# Patient Record
Sex: Male | Born: 1970 | Race: White | Hispanic: No | Marital: Married | Smoking: Never smoker
Health system: Southern US, Community
[De-identification: ages and names within clinical notes are randomized; demographics above are authoritative.]

## PROBLEM LIST (undated history)

## (undated) DIAGNOSIS — I10 Essential (primary) hypertension: Secondary | ICD-10-CM

## (undated) DIAGNOSIS — Z8619 Personal history of other infectious and parasitic diseases: Secondary | ICD-10-CM

## (undated) DIAGNOSIS — Z1283 Encounter for screening for malignant neoplasm of skin: Secondary | ICD-10-CM

## (undated) DIAGNOSIS — E785 Hyperlipidemia, unspecified: Secondary | ICD-10-CM

## (undated) DIAGNOSIS — Z87442 Personal history of urinary calculi: Secondary | ICD-10-CM

## (undated) DIAGNOSIS — J45909 Unspecified asthma, uncomplicated: Secondary | ICD-10-CM

## (undated) DIAGNOSIS — C801 Malignant (primary) neoplasm, unspecified: Secondary | ICD-10-CM

## (undated) DIAGNOSIS — R51 Headache: Secondary | ICD-10-CM

## (undated) DIAGNOSIS — T7840XA Allergy, unspecified, initial encounter: Secondary | ICD-10-CM

## (undated) DIAGNOSIS — K219 Gastro-esophageal reflux disease without esophagitis: Secondary | ICD-10-CM

## (undated) HISTORY — DX: Hyperlipidemia, unspecified: E78.5

## (undated) HISTORY — DX: Personal history of other infectious and parasitic diseases: Z86.19

## (undated) HISTORY — DX: Personal history of urinary calculi: Z87.442

## (undated) HISTORY — DX: Essential (primary) hypertension: I10

## (undated) HISTORY — DX: Allergy, unspecified, initial encounter: T78.40XA

## (undated) HISTORY — DX: Malignant (primary) neoplasm, unspecified: C80.1

## (undated) HISTORY — PX: LASIK: SHX215

## (undated) HISTORY — DX: Gastro-esophageal reflux disease without esophagitis: K21.9

## (undated) HISTORY — DX: Encounter for screening for malignant neoplasm of skin: Z12.83

## (undated) HISTORY — DX: Unspecified asthma, uncomplicated: J45.909

## (undated) HISTORY — PX: SHOULDER SURGERY: SHX246

## (undated) HISTORY — DX: Headache: R51

---

## 2000-09-03 HISTORY — PX: OTHER SURGICAL HISTORY: SHX169

## 2006-12-04 ENCOUNTER — Emergency Department (HOSPITAL_COMMUNITY): Admission: EM | Admit: 2006-12-04 | Discharge: 2006-12-04 | Payer: Self-pay | Admitting: Family Medicine

## 2007-05-24 ENCOUNTER — Emergency Department (HOSPITAL_COMMUNITY): Admission: EM | Admit: 2007-05-24 | Discharge: 2007-05-24 | Payer: Self-pay | Admitting: Family Medicine

## 2008-07-28 ENCOUNTER — Ambulatory Visit: Payer: Self-pay | Admitting: Family Medicine

## 2008-07-28 DIAGNOSIS — R51 Headache: Secondary | ICD-10-CM

## 2008-07-28 DIAGNOSIS — Z87442 Personal history of urinary calculi: Secondary | ICD-10-CM | POA: Insufficient documentation

## 2008-07-28 DIAGNOSIS — K219 Gastro-esophageal reflux disease without esophagitis: Secondary | ICD-10-CM

## 2008-07-28 DIAGNOSIS — J309 Allergic rhinitis, unspecified: Secondary | ICD-10-CM | POA: Insufficient documentation

## 2008-07-28 DIAGNOSIS — R519 Headache, unspecified: Secondary | ICD-10-CM | POA: Insufficient documentation

## 2008-08-01 ENCOUNTER — Ambulatory Visit: Payer: Self-pay | Admitting: Cardiology

## 2008-09-06 ENCOUNTER — Ambulatory Visit: Payer: Self-pay | Admitting: Family Medicine

## 2008-09-06 LAB — CONVERTED CEMR LAB
Blood in Urine, dipstick: NEGATIVE
Glucose, Urine, Semiquant: NEGATIVE
Nitrite: NEGATIVE
Protein, U semiquant: NEGATIVE
Specific Gravity, Urine: 1.01
Urobilinogen, UA: 0.2

## 2008-09-11 LAB — CONVERTED CEMR LAB
AST: 19 units/L (ref 0–37)
Albumin: 4 g/dL (ref 3.5–5.2)
BUN: 13 mg/dL (ref 6–23)
Basophils Relative: 1.5 % (ref 0.0–3.0)
Bilirubin, Direct: 0.1 mg/dL (ref 0.0–0.3)
Cholesterol: 206 mg/dL (ref 0–200)
Direct LDL: 127.9 mg/dL
Eosinophils Absolute: 0.2 10*3/uL (ref 0.0–0.7)
Lymphocytes Relative: 25 % (ref 12.0–46.0)
MCHC: 34.4 g/dL (ref 30.0–36.0)
MCV: 94.6 fL (ref 78.0–100.0)
Monocytes Absolute: 0.4 10*3/uL (ref 0.1–1.0)
Neutro Abs: 3.8 10*3/uL (ref 1.4–7.7)
Platelets: 204 10*3/uL (ref 150–400)
Potassium: 4.3 meq/L (ref 3.5–5.1)
RDW: 12.4 % (ref 11.5–14.6)
Sodium: 142 meq/L (ref 135–145)
TSH: 0.79 microintl units/mL (ref 0.35–5.50)
Total Bilirubin: 0.8 mg/dL (ref 0.3–1.2)
Triglycerides: 68 mg/dL (ref 0–149)
VLDL: 14 mg/dL (ref 0–40)
WBC: 6 10*3/uL (ref 4.5–10.5)

## 2008-09-18 ENCOUNTER — Ambulatory Visit: Payer: Self-pay | Admitting: Family Medicine

## 2008-09-18 DIAGNOSIS — D179 Benign lipomatous neoplasm, unspecified: Secondary | ICD-10-CM | POA: Insufficient documentation

## 2008-10-04 ENCOUNTER — Encounter: Payer: Self-pay | Admitting: Family Medicine

## 2008-11-07 ENCOUNTER — Encounter: Payer: Self-pay | Admitting: Family Medicine

## 2008-11-17 ENCOUNTER — Ambulatory Visit (HOSPITAL_BASED_OUTPATIENT_CLINIC_OR_DEPARTMENT_OTHER): Admission: RE | Admit: 2008-11-17 | Discharge: 2008-11-17 | Payer: Self-pay | Admitting: Surgery

## 2008-11-17 ENCOUNTER — Encounter (INDEPENDENT_AMBULATORY_CARE_PROVIDER_SITE_OTHER): Payer: Self-pay | Admitting: Surgery

## 2008-11-24 ENCOUNTER — Ambulatory Visit: Admission: RE | Admit: 2008-11-24 | Discharge: 2008-12-28 | Payer: Self-pay | Admitting: Radiation Oncology

## 2008-11-24 ENCOUNTER — Encounter: Payer: Self-pay | Admitting: Family Medicine

## 2008-12-15 ENCOUNTER — Encounter: Payer: Self-pay | Admitting: Family Medicine

## 2008-12-28 ENCOUNTER — Encounter: Payer: Self-pay | Admitting: Family Medicine

## 2009-05-01 ENCOUNTER — Ambulatory Visit: Payer: Self-pay | Admitting: Family Medicine

## 2009-05-01 DIAGNOSIS — B356 Tinea cruris: Secondary | ICD-10-CM

## 2009-05-24 ENCOUNTER — Telehealth: Payer: Self-pay | Admitting: Family Medicine

## 2009-06-29 ENCOUNTER — Encounter: Payer: Self-pay | Admitting: Family Medicine

## 2010-01-21 ENCOUNTER — Emergency Department (HOSPITAL_COMMUNITY): Admission: EM | Admit: 2010-01-21 | Discharge: 2010-01-21 | Payer: Self-pay | Admitting: Emergency Medicine

## 2010-06-10 ENCOUNTER — Ambulatory Visit: Payer: Self-pay | Admitting: Family Medicine

## 2010-06-10 DIAGNOSIS — IMO0002 Reserved for concepts with insufficient information to code with codable children: Secondary | ICD-10-CM

## 2010-06-21 ENCOUNTER — Ambulatory Visit: Payer: Self-pay | Admitting: Family Medicine

## 2010-10-14 ENCOUNTER — Ambulatory Visit: Payer: Self-pay | Admitting: Family Medicine

## 2010-10-14 DIAGNOSIS — L600 Ingrowing nail: Secondary | ICD-10-CM

## 2010-12-03 NOTE — Assessment & Plan Note (Signed)
Summary: R THUMB INFECTED? // RS   Vital Signs:  Patient profile:   40 year old male Weight:      206 pounds BMI:     26.91 BP sitting:   146 / 96  (left arm) Cuff size:   regular  Vitals Entered By: Raechel Ache, RN (June 10, 2010 4:24 PM) CC: C/o ?infection R thumb- sore and draining.   History of Present Illness: Here for one month of swelling and tenderness around  the right thumbnail. Sometimes he can squeeze pus out of the area.   Allergies (verified): No Known Drug Allergies  Past History:  Past Medical History: Reviewed history from 07/28/2008 and no changes required. Chickenpox Allergic rhinitis GERD Headache Nephrolithiasis, hx of  Past Surgical History: Reviewed history from 05/01/2009 and no changes required. 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 Manus Rudd, then had                                                                                                MOHS on 12-15-08 per Dr. Park Liter  Review of Systems  The patient denies anorexia, fever, weight loss, weight gain, vision loss, decreased hearing, hoarseness, chest pain, syncope, dyspnea on exertion, peripheral edema, prolonged cough, headaches, hemoptysis, abdominal pain, melena, hematochezia, severe indigestion/heartburn, hematuria, incontinence, genital sores, muscle weakness, suspicious skin lesions, transient blindness, difficulty walking, depression, unusual weight change, abnormal bleeding, enlarged lymph nodes, angioedema, breast masses, and testicular masses.    Physical Exam  General:  Well-developed,well-nourished,in no acute distress; alert,appropriate and cooperative throughout examination Skin:  the right thumb is slightly puffy and red and tender around the base of the nail.  Axillary Nodes:  No palpable lymphadenopathy   Impression & Recommendations:  Problem # 1:  PARONYCHIA (ICD-681.9)  His  updated medication list for this problem includes:    Keflex 500 Mg Caps (Cephalexin) .Marland Kitchen... Three times a day  Complete Medication List: 1)  Imitrex 100 Mg Tabs (Sumatriptan succinate) .... As needed 2)  Vicodin 5-500 Mg Tabs (Hydrocodone-acetaminophen) .Marland Kitchen.. 1 every 4 hours as needed pain 3)  Midrin 325-65-100 Mg Caps (Apap-isometheptene-dichloral) .... 2 at onset of ha, then one per hour until relief 4)  Flonase 50 Mcg/act Susp (Fluticasone propionate) .... 2 sprays each nostril once daily 5)  Ketoconazole 2 % Crea (Ketoconazole) .... Apply three times a day as needed 6)  Keflex 500 Mg Caps (Cephalexin) .... Three times a day 7)  Claritin-d 24 Hour 10-240 Mg Xr24h-tab (Loratadine-pseudoephedrine) .... Once daily  Patient Instructions: 1)  Please schedule a follow-up appointment as needed .  Prescriptions: KEFLEX 500 MG CAPS (CEPHALEXIN) three times a day  #30 x 0   Entered and Authorized by:   Nelwyn Salisbury MD   Signed by:   Nelwyn Salisbury MD on 06/10/2010   Method used:   Electronically to        [facility] [phone]* (retail)       [address]       [place], [place]  [address]       Ph: [phone]       Fax: [phone]   RxID:   [id number]

## 2010-12-03 NOTE — Assessment & Plan Note (Signed)
Summary: finger is still no better/dm   Vital Signs:  Patient profile:   40 year old male Weight:      206 pounds BMI:     26.91 BP sitting:   146 / 90  (left arm) Cuff size:   regular  Vitals Entered By: Raechel Ache, RN (June 21, 2010 2:42 PM) CC: F/u R thumb; took abx and still not cleared up.   History of Present Illness: Here for a paronychia on the edge of the right thumbnail. He finshed a course of keflex with no improvement.   Allergies (verified): No Known Drug Allergies  Past History:  Past Medical History: Reviewed history from 07/28/2008 and no changes required. Chickenpox Allergic rhinitis GERD Headache Nephrolithiasis, hx of  Review of Systems  The patient denies anorexia, fever, weight loss, weight gain, vision loss, decreased hearing, hoarseness, chest pain, syncope, dyspnea on exertion, peripheral edema, prolonged cough, headaches, hemoptysis, abdominal pain, melena, hematochezia, severe indigestion/heartburn, hematuria, incontinence, genital sores, muscle weakness, suspicious skin lesions, transient blindness, difficulty walking, depression, unusual weight change, abnormal bleeding, enlarged lymph nodes, angioedema, breast masses, and testicular masses.    Physical Exam  General:  Well-developed,well-nourished,in no acute distress; alert,appropriate and cooperative throughout examination Skin:  the right thumb has erythema ans swelling along the nail. tender but not fluctuant   Impression & Recommendations:  Problem # 1:  PARONYCHIA (ICD-681.9)  His updated medication list for this problem includes:    Doxycycline Hyclate 100 Mg Caps (Doxycycline hyclate) .Marland Kitchen..Marland Kitchen Two times a day  Complete Medication List: 1)  Imitrex 100 Mg Tabs (Sumatriptan succinate) .... As needed 2)  Vicodin 5-500 Mg Tabs (Hydrocodone-acetaminophen) .Marland Kitchen.. 1 every 4 hours as needed pain 3)  Midrin 325-65-100 Mg Caps (Apap-isometheptene-dichloral) .... 2 at onset of ha, then one  per hour until relief 4)  Flonase 50 Mcg/act Susp (Fluticasone propionate) .... 2 sprays each nostril once daily 5)  Ketoconazole 2 % Crea (Ketoconazole) .... Apply three times a day as needed 6)  Claritin-d 24 Hour 10-240 Mg Xr24h-tab (Loratadine-pseudoephedrine) .... Once daily 7)  Doxycycline Hyclate 100 Mg Caps (Doxycycline hyclate) .... Two times a day  Patient Instructions: 1)  Please schedule a follow-up appointment as needed . Try hot soaks with Epsom salts Prescriptions: DOXYCYCLINE HYCLATE 100 MG CAPS (DOXYCYCLINE HYCLATE) two times a day  #28 x 0   Entered and Authorized by:   Nelwyn Salisbury MD   Signed by:   Nelwyn Salisbury MD on 06/21/2010   Method used:   Electronically to        [facility] [address]* (retail)       [address]       [place], [place]  [address]       Ph: [phone]       Fax: [phone]   RxID:   [id number]

## 2010-12-05 NOTE — Assessment & Plan Note (Signed)
Summary: INFECTION? // RS   Vital Signs:  Patient profile:   40 year old male Weight:      199 pounds Temp:     98.5 degrees F Pulse rhythm:   regular BP sitting:   132 / 82  Vitals Entered By: Lynann Beaver CMA AAMA (October 14, 2010 10:33 AM) CC: still complaining of painful right thumb Is Patient Diabetic? No Pain Assessment Patient in pain? no        History of Present Illness: Here to recheck his right thumb and nail. He has had irritation along the nail edge for at least 6 months now, and he has taken several courses of antibiotics. The infeciton seems to be gone, but he has a calluses area of skin that stays tender.   Allergies: No Known Drug Allergies  Past History:  Past Medical History: Reviewed history from 07/28/2008 and no changes required. Chickenpox Allergic rhinitis GERD Headache Nephrolithiasis, hx of  Past Surgical History: Reviewed history from 05/01/2009 and no changes required. 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 Manus Rudd, then had                                                                                                MOHS on 12-15-08 per Dr. Park Liter  Review of Systems  The patient denies anorexia, fever, weight loss, weight gain, vision loss, decreased hearing, hoarseness, chest pain, syncope, dyspnea on exertion, peripheral edema, prolonged cough, headaches, hemoptysis, abdominal pain, melena, hematochezia, severe indigestion/heartburn, hematuria, incontinence, genital sores, muscle weakness, suspicious skin lesions, transient blindness, difficulty walking, depression, unusual weight change, abnormal bleeding, enlarged lymph nodes, angioedema, breast masses, and testicular masses.    Physical Exam  General:  Well-developed,well-nourished,in no acute distress; alert,appropriate and cooperative throughout examination Skin:  the right thumb has a  tender callused area at the edge of the nail which causes the nail to be ingrown   Impression & Recommendations:  Problem # 1:  INGROWN NAIL (ICD-703.0)  Complete Medication List: 1)  Imitrex 100 Mg Tabs (Sumatriptan succinate) .... As needed 2)  Vicodin 5-500 Mg Tabs (Hydrocodone-acetaminophen) .Marland Kitchen.. 1 every 4 hours as needed pain 3)  Midrin 325-65-100 Mg Caps (Apap-isometheptene-dichloral) .... 2 at onset of ha, then one per hour until relief 4)  Flonase 50 Mcg/act Susp (Fluticasone propionate) .... 2 sprays each nostril once daily  Patient Instructions: 1)  This area needs to be resected, so I suggested he contact Dr. Terri Piedra his dermatologist for this    Orders Added: 1)  Est. Patient Level III [[address]]

## 2011-02-17 LAB — BASIC METABOLIC PANEL
BUN: 11 mg/dL (ref 6–23)
Chloride: 108 mEq/L (ref 96–112)
Creatinine, Ser: 1.01 mg/dL (ref 0.4–1.5)
GFR calc Af Amer: >60 mL/min (ref >60)
Glucose, Bld: 97 mg/dL (ref 70–99)
Potassium: 4.1 mEq/L (ref 3.5–5.1)

## 2011-02-17 LAB — DIFFERENTIAL
Basophils Relative: 0 % (ref 0–1)
Eosinophils Relative: 3 % (ref 0–5)
Lymphocytes Relative: 31 % (ref 12–46)
Monocytes Absolute: 0.5 10*3/uL (ref 0.1–1.0)
Monocytes Relative: 9 % (ref 3–12)

## 2011-02-17 LAB — POCT HEMOGLOBIN-HEMACUE: Hemoglobin: 14.8 g/dL (ref 13.0–17.0)

## 2011-02-17 LAB — CBC
RBC: 4.23 MIL/uL (ref 4.22–5.81)
RDW: 12.7 % (ref 11.5–15.5)

## 2011-03-18 NOTE — Op Note (Signed)
Casey Allen, SPRUNG               ACCOUNT NO.:  [id number]   MEDICAL RECORD NO.:  [id number]          PATIENT TYPE:  AMB   LOCATION:  [facility]                          FACILITY:  [facility]   PHYSICIAN:  Wilmon Arms. Corliss Skains, M.D. DATE OF BIRTH:  Oct 31, 1971   DATE OF PROCEDURE:  11/17/2008  DATE OF DISCHARGE:                               OPERATIVE REPORT   PREOPERATIVE DIAGNOSIS:  Dermatofibrosarcoma of the left shoulder.   POSTOPERATIVE DIAGNOSIS:  Dermatofibrosarcoma of the left shoulder.   PROCEDURE PERFORMED:  Wide excision of a dermatofibrosarcoma of the left  shoulder.   SURGEON:  Wilmon Arms. Corliss Skains, MD, FACS   ANESTHESIA:  General.   INDICATIONS:  The patient is a 40 year old male who presented with a  palpable mass in the subcutaneous tissues of the left shoulder.  This  has been present for 5 or 6 years.  We excised this in the office.  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  He presents now  for wide excision with 1 cm margins.   DESCRIPTION OF PROCEDURE:  The patient was brought to the operating  room, placed in supine position on the stretcher.  After an adequate  level of general anesthesia was obtained, he was put to prone position  with his left arm at his side.  His left posterior shoulder was prepped  with Betadine and draped in sterile fashion.  A time-out was taken to  assure the proper patient and proper procedure.  I outlined an  elliptical incision around the previous biopsy site maintaining 1 cm  margins all the way round.  The skin incision was made with cautery.  We  excised all of the skin and subcutaneous tissue and the muscle fascia.  The specimen was oriented with a suture at its lateral margin.  This was  removed entirely and sent for pathologic examination.  The wound was  inspected for hemostasis.  We closed the wound with interrupted dermal 3-  0 Vicryl sutures.  A 4-0  Monocryl was used to close the skin incisions.  Steri-Strips and clean dressings were applied.  The patient was then  extubated and brought to the recovery room in stable condition.  All  sponge, instrument, and needle counts were correct.      Wilmon Arms. Tsuei, M.D.  Electronically Signed     MKT/MEDQ  D:  11/17/2008  T:  11/18/2008  Job:  [id number]

## 2011-05-21 ENCOUNTER — Encounter: Payer: Self-pay | Admitting: Family Medicine

## 2011-05-21 ENCOUNTER — Ambulatory Visit (INDEPENDENT_AMBULATORY_CARE_PROVIDER_SITE_OTHER): Payer: BC Managed Care – PPO | Admitting: Family Medicine

## 2011-05-21 VITALS — BP 148/92 | HR 66 | Temp 98.9°F | Wt 187.0 lb

## 2011-05-21 DIAGNOSIS — H6980 Other specified disorders of Eustachian tube, unspecified ear: Secondary | ICD-10-CM

## 2011-05-21 NOTE — Progress Notes (Signed)
  Subjective:    Patient ID: Casey Allen, male    DOB: 1971-06-25, 40 y.o.   MRN: [id number]  HPI Here for right sided ear pressure and muffled hearing which started when he woke up today. No pain or dizziness. No fever. He does have chronic sinus and nasal congestion. He has Flonase and Zyrtec D at home but he does not use these very often.    Review of Systems  Constitutional: Negative.   HENT: Positive for hearing loss, congestion, postnasal drip and sinus pressure. Negative for ear pain, tinnitus and ear discharge.   Eyes: Negative.   Respiratory: Negative.        Objective:   Physical Exam  Constitutional: He appears well-developed and well-nourished.  HENT:  Right Ear: External ear normal.  Left Ear: External ear normal.  Nose: Nose normal.  Mouth/Throat: Oropharynx is clear and moist. No oropharyngeal exudate.  Eyes: Conjunctivae are normal. Pupils are equal, round, and reactive to light.  Neck: Normal range of motion. Neck supple. No thyromegaly present.  Lymphadenopathy:    He has no cervical adenopathy.          Assessment & Plan:  Get back on Flonase and Zyrtec D on a daily basis.

## 2011-10-13 ENCOUNTER — Ambulatory Visit (INDEPENDENT_AMBULATORY_CARE_PROVIDER_SITE_OTHER): Payer: BC Managed Care – PPO | Admitting: Family Medicine

## 2011-10-13 ENCOUNTER — Encounter: Payer: Self-pay | Admitting: Family Medicine

## 2011-10-13 VITALS — BP 140/90 | HR 85 | Temp 98.5°F | Wt 185.0 lb

## 2011-10-13 DIAGNOSIS — J40 Bronchitis, not specified as acute or chronic: Secondary | ICD-10-CM

## 2011-10-13 MED ORDER — HYDROCODONE-HOMATROPINE 5-1.5 MG/5ML PO SYRP: 5.0000 mL | ORAL_SOLUTION | ORAL | Status: AC | PRN

## 2011-10-13 MED ORDER — AZITHROMYCIN 250 MG PO TABS: ORAL_TABLET | ORAL | Status: AC

## 2011-10-13 NOTE — Progress Notes (Signed)
  Subjective:    Patient ID: Casey Allen, male    DOB: 07/09/1971, 40 y.o.   MRN: [id number]  HPI Here for 2 weeks of PND, chest congestion, and coughing up green sputum. No fever.    Review of Systems  Constitutional: Negative.   HENT: Positive for congestion and postnasal drip.   Eyes: Negative.   Respiratory: Positive for cough.        Objective:   Physical Exam  Constitutional: He appears well-developed and well-nourished.  HENT:  Right Ear: External ear normal.  Left Ear: External ear normal.  Nose: Nose normal.  Mouth/Throat: Oropharynx is clear and moist. No oropharyngeal exudate.  Eyes: Conjunctivae are normal.  Pulmonary/Chest: Effort normal and breath sounds normal.  Lymphadenopathy:    He has no cervical adenopathy.          Assessment & Plan:  Recheck prn

## 2012-01-05 ENCOUNTER — Telehealth: Payer: Self-pay | Admitting: Family Medicine

## 2012-01-05 NOTE — Telephone Encounter (Signed)
fluticasone (FLONASE) 50 MCG/ACT nasal spray - wants ref[facility] into [facility] [address]

## 2012-01-06 NOTE — Telephone Encounter (Signed)
Pt is out of flonase °

## 2012-01-07 MED ORDER — FLUTICASONE PROPIONATE 50 MCG/ACT NA SUSP: 2.0000 | Freq: Every day | NASAL | Status: DC

## 2012-01-07 NOTE — Telephone Encounter (Signed)
Script sent e-scribe 

## 2012-04-13 ENCOUNTER — Other Ambulatory Visit (INDEPENDENT_AMBULATORY_CARE_PROVIDER_SITE_OTHER): Payer: BC Managed Care – PPO

## 2012-04-13 DIAGNOSIS — Z0000 Encounter for general adult medical examination without abnormal findings: Secondary | ICD-10-CM

## 2012-04-13 LAB — CBC WITH DIFFERENTIAL/PLATELET
Basophils Absolute: 0 10*3/uL (ref 0.0–0.1)
Basophils Relative: 0.8 % (ref 0.0–3.0)
HCT: 40.3 % (ref 39.0–52.0)
Hemoglobin: 13.5 g/dL (ref 13.0–17.0)
Lymphocytes Relative: 27.6 % (ref 12.0–46.0)
Lymphs Abs: 1.2 10*3/uL (ref 0.7–4.0)
MCHC: 33.5 g/dL (ref 30.0–36.0)
Neutro Abs: 2.7 10*3/uL (ref 1.4–7.7)
RBC: 4.27 Mil/uL (ref 4.22–5.81)
RDW: 13.3 % (ref 11.5–14.6)
WBC: 4.4 10*3/uL — ABNORMAL LOW (ref 4.5–10.5)

## 2012-04-13 LAB — LIPID PANEL
HDL: 73.7 mg/dL (ref >39.00)
Triglycerides: 48 mg/dL (ref 0.0–149.0)
VLDL: 9.6 mg/dL (ref 0.0–40.0)

## 2012-04-13 LAB — HEPATIC FUNCTION PANEL: AST: 20 U/L (ref 0–37)

## 2012-04-13 LAB — POCT URINALYSIS DIPSTICK
Bilirubin, UA: NEGATIVE
Blood, UA: NEGATIVE
Ketones, UA: NEGATIVE
Spec Grav, UA: 1.015
Urobilinogen, UA: 0.2
pH, UA: 7

## 2012-04-13 LAB — BASIC METABOLIC PANEL
Chloride: 108 mEq/L (ref 96–112)
Creatinine, Ser: 1.2 mg/dL (ref 0.4–1.5)
Glucose, Bld: 93 mg/dL (ref 70–99)
Sodium: 142 mEq/L (ref 135–145)

## 2012-04-19 ENCOUNTER — Encounter: Payer: Self-pay | Admitting: Family Medicine

## 2012-04-19 ENCOUNTER — Ambulatory Visit (INDEPENDENT_AMBULATORY_CARE_PROVIDER_SITE_OTHER): Payer: BC Managed Care – PPO | Admitting: Family Medicine

## 2012-04-19 VITALS — BP 138/88 | HR 68 | Temp 98.6°F | Ht 73.0 in | Wt 192.0 lb

## 2012-04-19 DIAGNOSIS — Z0000 Encounter for general adult medical examination without abnormal findings: Secondary | ICD-10-CM

## 2012-04-20 ENCOUNTER — Encounter: Payer: Self-pay | Admitting: Family Medicine

## 2012-04-20 NOTE — Progress Notes (Signed)
  Subjective:    Patient ID: Casey Allen, male    DOB: 06-28-1971, 41 y.o.   MRN: [id number]  HPI 41 yr old male for a cpx. He feels great but has a few questions. First he noticed a tiny lump at the base of the penis about 18 months ago. It has not changed and never bothers him, but he wonders what it is. Also for 6 months he has frequent bloody mucus from the right nostril when he blows his nose. It does not run down his face however. It is not sore or tender. He has been using Flonase sprays for several years for rhinitis, and it works well.    Review of Systems  Constitutional: Negative.   HENT: Negative.   Eyes: Negative.   Respiratory: Negative.   Cardiovascular: Negative.   Gastrointestinal: Negative.   Genitourinary: Negative.   Musculoskeletal: Negative.   Skin: Negative.   Neurological: Negative.   Hematological: Negative.   Psychiatric/Behavioral: Negative.        Objective:   Physical Exam  Constitutional: He is oriented to person, place, and time. He appears well-developed and well-nourished. No distress.  HENT:  Head: Normocephalic and atraumatic.  Right Ear: External ear normal.  Left Ear: External ear normal.  Nose: Nose normal.  Mouth/Throat: Oropharynx is clear and moist. No oropharyngeal exudate.  Eyes: Conjunctivae and EOM are normal. Pupils are equal, round, and reactive to light. Right eye exhibits no discharge. Left eye exhibits no discharge. No scleral icterus.  Neck: Neck supple. No JVD present. No tracheal deviation present. No thyromegaly present.  Cardiovascular: Normal rate, regular rhythm, normal heart sounds and intact distal pulses.  Exam reveals no gallop and no friction rub.   No murmur heard. Pulmonary/Chest: Effort normal and breath sounds normal. No respiratory distress. He has no wheezes. He has no rales. He exhibits no tenderness.  Abdominal: Soft. Bowel sounds are normal. He exhibits no distension and no mass. There is no tenderness.  There is no rebound and no guarding.  Genitourinary: Rectum normal, prostate normal and penis normal. Guaiac negative stool. No penile tenderness.       There is a tiny (no more than several mm in diameter) firm mobile non-tender mass at the posterior base of the penis.   Musculoskeletal: Normal range of motion. He exhibits no edema and no tenderness.  Lymphadenopathy:    He has no cervical adenopathy.  Neurological: He is alert and oriented to person, place, and time. He has normal reflexes. No cranial nerve deficit. He exhibits normal muscle tone. Coordination normal.  Skin: Skin is warm and dry. No rash noted. He is not diaphoretic. No erythema. No pallor.  Psychiatric: He has a normal mood and affect. His behavior is normal. Judgment and thought content normal.          Assessment & Plan:  Well exam. The penile lump appears to be benign, probably some scar tissue from minor trauma. He rides his bicycle a lot, and he takes [employer]. I reassured him that this should never pose a problem for him, just monitor it. He seems to have some nasal; irritation form the Flonase, so he will stop using this. Instead try saline nasal sprays.

## 2012-10-05 ENCOUNTER — Other Ambulatory Visit: Payer: Self-pay | Admitting: *Deleted

## 2012-10-05 ENCOUNTER — Encounter (INDEPENDENT_AMBULATORY_CARE_PROVIDER_SITE_OTHER): Payer: BC Managed Care – PPO

## 2012-10-05 DIAGNOSIS — M7989 Other specified soft tissue disorders: Secondary | ICD-10-CM

## 2012-10-05 DIAGNOSIS — M79609 Pain in unspecified limb: Secondary | ICD-10-CM

## 2013-01-01 ENCOUNTER — Other Ambulatory Visit: Payer: Self-pay | Admitting: Family Medicine

## 2013-06-09 ENCOUNTER — Other Ambulatory Visit: Payer: Self-pay | Admitting: Family Medicine

## 2013-08-22 ENCOUNTER — Other Ambulatory Visit: Payer: Self-pay | Admitting: Family Medicine

## 2013-09-26 ENCOUNTER — Other Ambulatory Visit: Payer: Self-pay | Admitting: Family Medicine

## 2013-11-08 ENCOUNTER — Other Ambulatory Visit: Payer: Self-pay | Admitting: Family Medicine

## 2013-12-16 ENCOUNTER — Other Ambulatory Visit: Payer: Self-pay | Admitting: Family Medicine

## 2014-05-02 ENCOUNTER — Other Ambulatory Visit (INDEPENDENT_AMBULATORY_CARE_PROVIDER_SITE_OTHER): Payer: BC Managed Care – PPO

## 2014-05-02 DIAGNOSIS — Z0000 Encounter for general adult medical examination without abnormal findings: Secondary | ICD-10-CM

## 2014-05-02 LAB — BASIC METABOLIC PANEL
BUN: 16 mg/dL (ref 6–23)
CALCIUM: 9.1 mg/dL (ref 8.4–10.5)
CHLORIDE: 102 meq/L (ref 96–112)
CO2: 31 mEq/L (ref 19–32)
Creatinine, Ser: 1.4 mg/dL (ref 0.4–1.5)
GFR: 60.22 mL/min (ref >60.00)
GLUCOSE: 110 mg/dL — AB (ref 70–99)
POTASSIUM: 4.7 meq/L (ref 3.5–5.1)
SODIUM: 138 meq/L (ref 135–145)

## 2014-05-02 LAB — CBC WITH DIFFERENTIAL/PLATELET
BASOS ABS: 0 10*3/uL (ref 0.0–0.1)
Basophils Relative: 0.8 % (ref 0.0–3.0)
EOS ABS: 0.2 10*3/uL (ref 0.0–0.7)
EOS PCT: 3.2 % (ref 0.0–5.0)
HCT: 42.7 % (ref 39.0–52.0)
HEMOGLOBIN: 14.2 g/dL (ref 13.0–17.0)
LYMPHS ABS: 1.5 10*3/uL (ref 0.7–4.0)
Lymphocytes Relative: 29.7 % (ref 12.0–46.0)
MCHC: 33.4 g/dL (ref 30.0–36.0)
MCV: 94.7 fl (ref 78.0–100.0)
MONOS PCT: 7.3 % (ref 3.0–12.0)
Monocytes Absolute: 0.4 10*3/uL (ref 0.1–1.0)
NEUTROS PCT: 59 % (ref 43.0–77.0)
Neutro Abs: 2.9 10*3/uL (ref 1.4–7.7)
PLATELETS: 200 10*3/uL (ref 150.0–400.0)
RBC: 4.51 Mil/uL (ref 4.22–5.81)
RDW: 13.3 % (ref 11.5–15.5)
WBC: 5 10*3/uL (ref 4.0–10.5)

## 2014-05-02 LAB — HEPATIC FUNCTION PANEL
ALT: 17 U/L (ref 0–53)
AST: 21 U/L (ref 0–37)
Albumin: 4.4 g/dL (ref 3.5–5.2)
Alkaline Phosphatase: 38 U/L — ABNORMAL LOW (ref 39–117)
BILIRUBIN DIRECT: 0 mg/dL (ref 0.0–0.3)
TOTAL PROTEIN: 7.5 g/dL (ref 6.0–8.3)
Total Bilirubin: 1 mg/dL (ref 0.2–1.2)

## 2014-05-02 LAB — POCT URINALYSIS DIPSTICK
BILIRUBIN UA: NEGATIVE
GLUCOSE UA: NEGATIVE
Ketones, UA: NEGATIVE
Nitrite, UA: NEGATIVE
PH UA: 6
PROTEIN UA: NEGATIVE
Spec Grav, UA: 1.015
UROBILINOGEN UA: 0.2

## 2014-05-02 LAB — LIPID PANEL
Cholesterol: 210 mg/dL — ABNORMAL HIGH (ref 0–200)
HDL: 83.5 mg/dL (ref >39.00)
LDL CALC: 112 mg/dL — AB (ref 0–99)
NonHDL: 126.5
Total CHOL/HDL Ratio: 3
Triglycerides: 73 mg/dL (ref 0.0–149.0)
VLDL: 14.6 mg/dL (ref 0.0–40.0)

## 2014-05-02 LAB — TSH: TSH: 0.09 u[IU]/mL — ABNORMAL LOW (ref 0.35–4.50)

## 2014-05-09 ENCOUNTER — Encounter: Payer: Self-pay | Admitting: Family Medicine

## 2014-05-09 ENCOUNTER — Ambulatory Visit (INDEPENDENT_AMBULATORY_CARE_PROVIDER_SITE_OTHER): Payer: BC Managed Care – PPO | Admitting: Family Medicine

## 2014-05-09 VITALS — BP 156/92 | HR 77 | Temp 99.1°F | Ht 73.0 in | Wt 192.0 lb

## 2014-05-09 DIAGNOSIS — R7309 Other abnormal glucose: Secondary | ICD-10-CM

## 2014-05-09 DIAGNOSIS — Z0000 Encounter for general adult medical examination without abnormal findings: Secondary | ICD-10-CM

## 2014-05-09 DIAGNOSIS — R7989 Other specified abnormal findings of blood chemistry: Secondary | ICD-10-CM

## 2014-05-09 DIAGNOSIS — R739 Hyperglycemia, unspecified: Secondary | ICD-10-CM

## 2014-05-09 DIAGNOSIS — R0602 Shortness of breath: Secondary | ICD-10-CM

## 2014-05-09 LAB — HEMOGLOBIN A1C: Hgb A1c MFr Bld: 5.8 % (ref 4.6–6.5)

## 2014-05-09 MED ORDER — LISINOPRIL 10 MG PO TABS: 10.0000 mg | ORAL_TABLET | Freq: Every day | ORAL | Status: DC

## 2014-05-09 MED ORDER — ALBUTEROL SULFATE HFA 108 (90 BASE) MCG/ACT IN AERS: 2.0000 | INHALATION_SPRAY | RESPIRATORY_TRACT | Status: DC | PRN

## 2014-05-09 NOTE — Progress Notes (Signed)
   Subjective:    Patient ID: Casey Allen, male    DOB: 1971-01-10, 43 y.o.   MRN: [id number]  HPI 43 yr old male for a cpx. He feels fine except for some mild SOB on exertion. He is very active and works out on a regular basis, but he is concerned about the possibility of any heart disease. No chest pains. His weight has been stable.    Review of Systems  Constitutional: Negative.   HENT: Negative.   Eyes: Negative.   Respiratory: Positive for shortness of breath. Negative for apnea, cough, choking, chest tightness, wheezing and stridor.   Cardiovascular: Negative.   Gastrointestinal: Negative.   Genitourinary: Negative.   Musculoskeletal: Negative.   Skin: Negative.   Neurological: Negative.   Psychiatric/Behavioral: Negative.        Objective:   Physical Exam  Constitutional: He is oriented to person, place, and time. He appears well-developed and well-nourished. No distress.  HENT:  Head: Normocephalic and atraumatic.  Right Ear: External ear normal.  Left Ear: External ear normal.  Nose: Nose normal.  Mouth/Throat: Oropharynx is clear and moist. No oropharyngeal exudate.  Eyes: Conjunctivae and EOM are normal. Pupils are equal, round, and reactive to light. Right eye exhibits no discharge. Left eye exhibits no discharge. No scleral icterus.  Neck: Neck supple. No JVD present. No tracheal deviation present. No thyromegaly present.  Cardiovascular: Normal rate, regular rhythm, normal heart sounds and intact distal pulses.  Exam reveals no gallop and no friction rub.   No murmur heard. EKG shows possible LVH   Pulmonary/Chest: Effort normal and breath sounds normal. No respiratory distress. He has no wheezes. He has no rales. He exhibits no tenderness.  Abdominal: Soft. Bowel sounds are normal. He exhibits no distension and no mass. There is no tenderness. There is no rebound and no guarding.  Genitourinary: Rectum normal, prostate normal and penis normal. Guaiac negative  stool. No penile tenderness.  Musculoskeletal: Normal range of motion. He exhibits no edema and no tenderness.  Lymphadenopathy:    He has no cervical adenopathy.  Neurological: He is alert and oriented to person, place, and time. He has normal reflexes. No cranial nerve deficit. He exhibits normal muscle tone. Coordination normal.  Skin: Skin is warm and dry. No rash noted. He is not diaphoretic. No erythema. No pallor.  Psychiatric: He has a normal mood and affect. His behavior is normal. Judgment and thought content normal.          Assessment & Plan:  Well exam. His labs showed a fasting glucose of 110 so we will check an A1c today. His TSH was also quite depressed so we will check a full thyroid panel today. Set up an ECHO soon. Start on Lisinopril 10 mg daily for the HTN and recheck in one month.

## 2014-05-09 NOTE — Progress Notes (Signed)
Pre visit review using our clinic review tool, if applicable. No additional management support is needed unless otherwise documented below in the visit note. 

## 2014-05-10 LAB — T3, FREE: T3, Free: 2.8 pg/mL (ref 2.3–4.2)

## 2014-05-10 LAB — T4, FREE: Free T4: 0.99 ng/dL (ref 0.60–1.60)

## 2014-05-10 LAB — TSH: TSH: 0.37 u[IU]/mL (ref 0.35–4.50)

## 2014-05-11 ENCOUNTER — Ambulatory Visit (HOSPITAL_COMMUNITY)
Admission: RE | Admit: 2014-05-11 | Discharge: 2014-05-11 | Disposition: A | Discharge status: Home / Self Care | Payer: BC Managed Care – PPO | Source: Ambulatory Visit | Attending: Internal Medicine | Admitting: Internal Medicine

## 2014-05-11 DIAGNOSIS — R0602 Shortness of breath: Secondary | ICD-10-CM

## 2014-05-11 NOTE — Progress Notes (Signed)
2D Echocardiogram Complete.  05/11/2014   Gordy Goar, RDCS

## 2014-06-09 ENCOUNTER — Ambulatory Visit (INDEPENDENT_AMBULATORY_CARE_PROVIDER_SITE_OTHER): Payer: BC Managed Care – PPO | Admitting: Family Medicine

## 2014-06-09 ENCOUNTER — Encounter: Payer: Self-pay | Admitting: Family Medicine

## 2014-06-09 VITALS — BP 140/72 | HR 78 | Temp 98.6°F | Ht 73.0 in | Wt 190.0 lb

## 2014-06-09 DIAGNOSIS — I10 Essential (primary) hypertension: Secondary | ICD-10-CM

## 2014-06-09 MED ORDER — LISINOPRIL 20 MG PO TABS: 20.0000 mg | ORAL_TABLET | Freq: Every day | ORAL | Status: DC

## 2014-06-09 NOTE — Progress Notes (Signed)
   Subjective:    Patient ID: Casey Allen, male    DOB: 12-Oct-1971, 43 y.o.   MRN: [id number]  HPI Here to follow up on HTN and SOB on exertion. One month ago he was here with a BP of 156/92 and he was feeling SOB when he exercised. We got some labs and an ECHO, which were all normal, and we started him on Lisinopril 10 mg daily. Since then he has felt fine with no more SOB.    Review of Systems  Constitutional: Negative.   Respiratory: Negative.   Cardiovascular: Negative.        Objective:   Physical Exam  Constitutional: He appears well-developed and well-nourished.  Cardiovascular: Normal rate, regular rhythm, normal heart sounds and intact distal pulses.   Pulmonary/Chest: Effort normal and breath sounds normal.          Assessment & Plan:  His BP is much improved but we will increase the Lisinopril to 20 mg daly. He will follow this at home and recheck here in 6 months

## 2014-06-09 NOTE — Progress Notes (Signed)
Pre visit review using our clinic review tool, if applicable. No additional management support is needed unless otherwise documented below in the visit note. 

## 2014-06-12 ENCOUNTER — Telehealth: Payer: Self-pay | Admitting: Family Medicine

## 2014-06-12 NOTE — Telephone Encounter (Signed)
Relevant patient education assigned to patient using Emmi. ° °

## 2014-12-11 ENCOUNTER — Ambulatory Visit (INDEPENDENT_AMBULATORY_CARE_PROVIDER_SITE_OTHER): Payer: BLUE CROSS/BLUE SHIELD | Admitting: Family Medicine

## 2014-12-11 ENCOUNTER — Encounter: Payer: Self-pay | Admitting: Family Medicine

## 2014-12-11 VITALS — BP 155/94 | HR 76 | Temp 98.4°F | Ht 73.0 in | Wt 186.0 lb

## 2014-12-11 DIAGNOSIS — I10 Essential (primary) hypertension: Secondary | ICD-10-CM

## 2014-12-11 MED ORDER — AMLODIPINE BESYLATE 5 MG PO TABS: 5.0000 mg | ORAL_TABLET | Freq: Every day | ORAL | Status: DC

## 2014-12-11 NOTE — Progress Notes (Signed)
   Subjective:    Patient ID: Casey Allen, male    DOB: Aug 25, 1971, 44 y.o.   MRN: [id number]  HPI Here to follow up HTN. He feels fine but his BP at home remains high, usually in the 150s or 160s over 90s.   Review of Systems  Constitutional: Negative.   Respiratory: Negative.   Cardiovascular: Negative.        Objective:   Physical Exam  Constitutional: He appears well-developed and well-nourished.  Cardiovascular: Normal rate, regular rhythm, normal heart sounds and intact distal pulses.   Pulmonary/Chest: Effort normal and breath sounds normal.          Assessment & Plan:  Add Amlodipine 5 mg daily. Recheck one month

## 2014-12-11 NOTE — Progress Notes (Signed)
Pre visit review using our clinic review tool, if applicable. No additional management support is needed unless otherwise documented below in the visit note. 

## 2015-01-02 ENCOUNTER — Encounter: Payer: Self-pay | Admitting: Family Medicine

## 2015-01-05 NOTE — Telephone Encounter (Signed)
Try taking 2 Amlodipine tablets every day (for a total of 10 mg) and get back with [place] a week later

## 2015-02-06 ENCOUNTER — Telehealth: Payer: Self-pay | Admitting: Family Medicine

## 2015-02-06 MED ORDER — AMLODIPINE BESYLATE 10 MG PO TABS: 10.0000 mg | ORAL_TABLET | Freq: Every day | ORAL | Status: DC

## 2015-02-06 NOTE — Telephone Encounter (Signed)
Sent pt a message in mychart

## 2015-02-06 NOTE — Telephone Encounter (Signed)
Excellent. Stay on a total of amlodipine 10 mg daily. Use up the 5's that you have. Please send him in a one year supply of 10 mg tablets to take daily

## 2015-02-06 NOTE — Telephone Encounter (Signed)
This is a duplicate note.  

## 2015-06-05 ENCOUNTER — Other Ambulatory Visit: Payer: Self-pay | Admitting: Family Medicine

## 2015-07-12 ENCOUNTER — Other Ambulatory Visit (INDEPENDENT_AMBULATORY_CARE_PROVIDER_SITE_OTHER): Payer: BLUE CROSS/BLUE SHIELD

## 2015-07-12 DIAGNOSIS — Z0000 Encounter for general adult medical examination without abnormal findings: Secondary | ICD-10-CM

## 2015-07-12 LAB — LIPID PANEL
Cholesterol: 196 mg/dL (ref 0–200)
HDL: 79.9 mg/dL (ref >39.00)
LDL Cholesterol: 107 mg/dL — ABNORMAL HIGH (ref 0–99)
NonHDL: 116.24
Total CHOL/HDL Ratio: 2
Triglycerides: 45 mg/dL (ref 0.0–149.0)
VLDL: 9 mg/dL (ref 0.0–40.0)

## 2015-07-12 LAB — POCT URINALYSIS DIPSTICK
Bilirubin, UA: NEGATIVE
Blood, UA: NEGATIVE
Glucose, UA: NEGATIVE
KETONES UA: NEGATIVE
Leukocytes, UA: NEGATIVE
Nitrite, UA: NEGATIVE
PH UA: 7
PROTEIN UA: NEGATIVE
SPEC GRAV UA: 1.01
UROBILINOGEN UA: 0.2

## 2015-07-12 LAB — HEPATIC FUNCTION PANEL
ALT: 13 U/L (ref 0–53)
AST: 17 U/L (ref 0–37)
Albumin: 4.3 g/dL (ref 3.5–5.2)
Alkaline Phosphatase: 38 U/L — ABNORMAL LOW (ref 39–117)
BILIRUBIN TOTAL: 0.7 mg/dL (ref 0.2–1.2)
Bilirubin, Direct: 0.2 mg/dL (ref 0.0–0.3)
TOTAL PROTEIN: 6.9 g/dL (ref 6.0–8.3)

## 2015-07-12 LAB — BASIC METABOLIC PANEL
BUN: 17 mg/dL (ref 6–23)
CO2: 29 meq/L (ref 19–32)
CREATININE: 1.2 mg/dL (ref 0.40–1.50)
Calcium: 9.3 mg/dL (ref 8.4–10.5)
Chloride: 105 mEq/L (ref 96–112)
GFR: 69.21 mL/min (ref >60.00)
GLUCOSE: 99 mg/dL (ref 70–99)
Potassium: 4.8 mEq/L (ref 3.5–5.1)
Sodium: 142 mEq/L (ref 135–145)

## 2015-07-12 LAB — CBC WITH DIFFERENTIAL/PLATELET
BASOS PCT: 0.7 % (ref 0.0–3.0)
Basophils Absolute: 0 10*3/uL (ref 0.0–0.1)
EOS ABS: 0.1 10*3/uL (ref 0.0–0.7)
EOS PCT: 2.8 % (ref 0.0–5.0)
HCT: 39.2 % (ref 39.0–52.0)
Hemoglobin: 13.1 g/dL (ref 13.0–17.0)
Lymphocytes Relative: 26.6 % (ref 12.0–46.0)
Lymphs Abs: 1.4 10*3/uL (ref 0.7–4.0)
MCHC: 33.5 g/dL (ref 30.0–36.0)
MCV: 95.5 fl (ref 78.0–100.0)
MONO ABS: 0.5 10*3/uL (ref 0.1–1.0)
Monocytes Relative: 9.3 % (ref 3.0–12.0)
NEUTROS ABS: 3.2 10*3/uL (ref 1.4–7.7)
Neutrophils Relative %: 60.6 % (ref 43.0–77.0)
PLATELETS: 195 10*3/uL (ref 150.0–400.0)
RBC: 4.1 Mil/uL — ABNORMAL LOW (ref 4.22–5.81)
RDW: 13.8 % (ref 11.5–15.5)
WBC: 5.3 10*3/uL (ref 4.0–10.5)

## 2015-07-12 LAB — TSH: TSH: 0.52 u[IU]/mL (ref 0.35–4.50)

## 2015-07-17 ENCOUNTER — Encounter: Payer: Self-pay | Admitting: Family Medicine

## 2015-07-17 ENCOUNTER — Ambulatory Visit (INDEPENDENT_AMBULATORY_CARE_PROVIDER_SITE_OTHER): Payer: BLUE CROSS/BLUE SHIELD | Admitting: Family Medicine

## 2015-07-17 VITALS — BP 132/74 | HR 71 | Temp 98.8°F | Ht 73.0 in | Wt 184.0 lb

## 2015-07-17 DIAGNOSIS — Z0000 Encounter for general adult medical examination without abnormal findings: Secondary | ICD-10-CM

## 2015-07-17 MED ORDER — ALBUTEROL SULFATE HFA 108 (90 BASE) MCG/ACT IN AERS: 2.0000 | INHALATION_SPRAY | RESPIRATORY_TRACT | Status: DC | PRN

## 2015-07-17 NOTE — Progress Notes (Signed)
   Subjective:    Patient ID: Casey Allen, male    DOB: September 14, 1971, 44 y.o.   MRN: [id number]  HPI 44 yr old male for a cpx. He is doing well. His BP is stable. He works out regularly.    Review of Systems  Constitutional: Negative.   HENT: Negative.   Eyes: Negative.   Respiratory: Negative.   Cardiovascular: Negative.   Gastrointestinal: Negative.   Genitourinary: Negative.   Musculoskeletal: Negative.   Skin: Negative.   Neurological: Negative.   Psychiatric/Behavioral: Negative.        Objective:   Physical Exam  Constitutional: He is oriented to person, place, and time. He appears well-developed and well-nourished. No distress.  HENT:  Head: Normocephalic and atraumatic.  Right Ear: External ear normal.  Left Ear: External ear normal.  Nose: Nose normal.  Mouth/Throat: Oropharynx is clear and moist. No oropharyngeal exudate.  Eyes: Conjunctivae and EOM are normal. Pupils are equal, round, and reactive to light. Right eye exhibits no discharge. Left eye exhibits no discharge. No scleral icterus.  Neck: Neck supple. No JVD present. No tracheal deviation present. No thyromegaly present.  Cardiovascular: Normal rate, regular rhythm, normal heart sounds and intact distal pulses.  Exam reveals no gallop and no friction rub.   No murmur heard. Pulmonary/Chest: Effort normal and breath sounds normal. No respiratory distress. He has no wheezes. He has no rales. He exhibits no tenderness.  Abdominal: Soft. Bowel sounds are normal. He exhibits no distension and no mass. There is no tenderness. There is no rebound and no guarding.  Genitourinary: Penis normal. Guaiac negative stool. No penile tenderness.  Musculoskeletal: Normal range of motion. He exhibits no edema or tenderness.  Lymphadenopathy:    He has no cervical adenopathy.  Neurological: He is alert and oriented to person, place, and time. He has normal reflexes. No cranial nerve deficit. He exhibits normal muscle tone.  Coordination normal.  Skin: Skin is warm and dry. No rash noted. He is not diaphoretic. No erythema. No pallor.  Psychiatric: He has a normal mood and affect. His behavior is normal. Judgment and thought content normal.          Assessment & Plan:  Well exam. We discussed diet and exercise advice.

## 2015-07-17 NOTE — Progress Notes (Signed)
Pre visit review using our clinic review tool, if applicable. No additional management support is needed unless otherwise documented below in the visit note. Pt will get flu vaccine later at [employer].

## 2016-01-15 ENCOUNTER — Other Ambulatory Visit: Payer: Self-pay | Admitting: Family Medicine

## 2016-05-28 ENCOUNTER — Other Ambulatory Visit: Payer: Self-pay | Admitting: Family Medicine

## 2016-10-15 ENCOUNTER — Other Ambulatory Visit (INDEPENDENT_AMBULATORY_CARE_PROVIDER_SITE_OTHER): Payer: BLUE CROSS/BLUE SHIELD

## 2016-10-15 DIAGNOSIS — Z0000 Encounter for general adult medical examination without abnormal findings: Secondary | ICD-10-CM | POA: Diagnosis not present

## 2016-10-15 LAB — POC URINALSYSI DIPSTICK (AUTOMATED)
Bilirubin, UA: NEGATIVE
GLUCOSE UA: NEGATIVE
Ketones, UA: NEGATIVE
Leukocytes, UA: NEGATIVE
NITRITE UA: NEGATIVE
PH UA: 7
PROTEIN UA: NEGATIVE
RBC UA: NEGATIVE
SPEC GRAV UA: 1.015
UROBILINOGEN UA: 0.2

## 2016-10-15 LAB — LIPID PANEL
Cholesterol: 217 mg/dL — ABNORMAL HIGH (ref 0–200)
HDL: 79.2 mg/dL (ref >39.00)
LDL Cholesterol: 125 mg/dL — ABNORMAL HIGH (ref 0–99)
NONHDL: 137.45
TRIGLYCERIDES: 64 mg/dL (ref 0.0–149.0)
Total CHOL/HDL Ratio: 3
VLDL: 12.8 mg/dL (ref 0.0–40.0)

## 2016-10-15 LAB — BASIC METABOLIC PANEL
BUN: 13 mg/dL (ref 6–23)
CHLORIDE: 105 meq/L (ref 96–112)
CO2: 31 mEq/L (ref 19–32)
Calcium: 9.1 mg/dL (ref 8.4–10.5)
Creatinine, Ser: 1.31 mg/dL (ref 0.40–1.50)
GFR: 62.21 mL/min (ref >60.00)
Glucose, Bld: 112 mg/dL — ABNORMAL HIGH (ref 70–99)
Potassium: 4.1 mEq/L (ref 3.5–5.1)
SODIUM: 142 meq/L (ref 135–145)

## 2016-10-15 LAB — CBC WITH DIFFERENTIAL/PLATELET
BASOS PCT: 0.8 % (ref 0.0–3.0)
Basophils Absolute: 0 10*3/uL (ref 0.0–0.1)
Eosinophils Absolute: 0.1 10*3/uL (ref 0.0–0.7)
Eosinophils Relative: 2.8 % (ref 0.0–5.0)
HEMATOCRIT: 40 % (ref 39.0–52.0)
HEMOGLOBIN: 13.4 g/dL (ref 13.0–17.0)
LYMPHS PCT: 30.2 % (ref 12.0–46.0)
Lymphs Abs: 1.4 10*3/uL (ref 0.7–4.0)
MCHC: 33.7 g/dL (ref 30.0–36.0)
MCV: 93.4 fl (ref 78.0–100.0)
MONOS PCT: 8 % (ref 3.0–12.0)
Monocytes Absolute: 0.4 10*3/uL (ref 0.1–1.0)
NEUTROS ABS: 2.8 10*3/uL (ref 1.4–7.7)
Neutrophils Relative %: 58.2 % (ref 43.0–77.0)
PLATELETS: 225 10*3/uL (ref 150.0–400.0)
RBC: 4.28 Mil/uL (ref 4.22–5.81)
RDW: 13.6 % (ref 11.5–15.5)
WBC: 4.7 10*3/uL (ref 4.0–10.5)

## 2016-10-15 LAB — HEPATIC FUNCTION PANEL
ALT: 12 U/L (ref 0–53)
AST: 15 U/L (ref 0–37)
Albumin: 4.4 g/dL (ref 3.5–5.2)
Alkaline Phosphatase: 38 U/L — ABNORMAL LOW (ref 39–117)
Bilirubin, Direct: 0.1 mg/dL (ref 0.0–0.3)
TOTAL PROTEIN: 6.6 g/dL (ref 6.0–8.3)
Total Bilirubin: 0.7 mg/dL (ref 0.2–1.2)

## 2016-10-15 LAB — TSH: TSH: 0.3 u[IU]/mL — ABNORMAL LOW (ref 0.35–4.50)

## 2016-10-22 ENCOUNTER — Ambulatory Visit (INDEPENDENT_AMBULATORY_CARE_PROVIDER_SITE_OTHER): Payer: BLUE CROSS/BLUE SHIELD | Admitting: Family Medicine

## 2016-10-22 ENCOUNTER — Encounter: Payer: Self-pay | Admitting: Family Medicine

## 2016-10-22 VITALS — BP 125/72 | HR 66 | Temp 98.5°F | Ht 73.0 in | Wt 192.0 lb

## 2016-10-22 DIAGNOSIS — Z0000 Encounter for general adult medical examination without abnormal findings: Secondary | ICD-10-CM

## 2016-10-22 MED ORDER — LISINOPRIL 20 MG PO TABS: 20.0000 mg | ORAL_TABLET | Freq: Every day | ORAL | 3 refills | Status: DC

## 2016-10-22 MED ORDER — AMLODIPINE BESYLATE 10 MG PO TABS: 10.0000 mg | ORAL_TABLET | Freq: Every day | ORAL | 3 refills | Status: DC

## 2016-10-22 MED ORDER — AZELASTINE HCL 137 MCG/SPRAY NA SOLN: 2.0000 "application " | Freq: Two times a day (BID) | NASAL | 5 refills | Status: DC

## 2016-10-22 MED ORDER — ALBUTEROL SULFATE HFA 108 (90 BASE) MCG/ACT IN AERS: 2.0000 | INHALATION_SPRAY | RESPIRATORY_TRACT | 5 refills | Status: DC | PRN

## 2016-10-22 NOTE — Progress Notes (Signed)
   Subjective:    Patient ID: Casey Allen, male    DOB: 12-01-1970, 45 y.o.   MRN: [id number]  HPI 45 yr old male for a well exam. He has a few issues to talk about. First he noticed a new spot on the skin of the left upper back and he is worried about it. He sees the dermatologist once every year or so for skin checks. Also he has chronic stuffiness in the nose. He has tried Flonase with no relief. His BP at home has been stable.    Review of Systems  Constitutional: Negative.   HENT: Positive for congestion. Negative for dental problem, drooling, ear discharge, ear pain, facial swelling, hearing loss, mouth sores, nosebleeds, postnasal drip, rhinorrhea, sinus pain, sinus pressure, sneezing, sore throat, tinnitus, trouble swallowing and voice change.   Eyes: Negative.   Respiratory: Negative.   Cardiovascular: Negative.   Gastrointestinal: Negative.   Genitourinary: Negative.   Musculoskeletal: Negative.   Skin: Negative.   Neurological: Negative.   Psychiatric/Behavioral: Negative.        Objective:   Physical Exam  Constitutional: He is oriented to person, place, and time. He appears well-developed and well-nourished. No distress.  HENT:  Head: Normocephalic and atraumatic.  Right Ear: External ear normal.  Left Ear: External ear normal.  Nose: Nose normal.  Mouth/Throat: Oropharynx is clear and moist. No oropharyngeal exudate.  Eyes: Conjunctivae and EOM are normal. Pupils are equal, round, and reactive to light. Right eye exhibits no discharge. Left eye exhibits no discharge. No scleral icterus.  Neck: Neck supple. No JVD present. No tracheal deviation present. No thyromegaly present.  Cardiovascular: Normal rate, regular rhythm, normal heart sounds and intact distal pulses.  Exam reveals no gallop and no friction rub.   No murmur heard. Pulmonary/Chest: Effort normal and breath sounds normal. No respiratory distress. He has no wheezes. He has no rales. He exhibits no  tenderness.  Abdominal: Soft. Bowel sounds are normal. He exhibits no distension and no mass. There is no tenderness. There is no rebound and no guarding.  Genitourinary: Rectum normal, prostate normal and penis normal. Rectal exam shows guaiac negative stool. No penile tenderness.  Musculoskeletal: Normal range of motion. He exhibits no edema or tenderness.  Lymphadenopathy:    He has no cervical adenopathy.  Neurological: He is alert and oriented to person, place, and time. He has normal reflexes. No cranial nerve deficit. He exhibits normal muscle tone. Coordination normal.  Skin: Skin is warm and dry. No rash noted. He is not diaphoretic. No erythema. No pallor.  There is a macular spot of speckled skin on the left upper back. Part of this is pink and part light brown   Psychiatric: He has a normal mood and affect. His behavior is normal. Judgment and thought content normal.          Assessment & Plan:  Well exam. We discussed diet and exercise. He will try Azelastine nasal sprays. I suggested he return to the [facility] to check the spot on his back.  Alysia Penna, MD

## 2016-10-22 NOTE — Progress Notes (Signed)
Pre visit review using our clinic review tool, if applicable. No additional management support is needed unless otherwise documented below in the visit note. 

## 2017-01-22 ENCOUNTER — Ambulatory Visit (INDEPENDENT_AMBULATORY_CARE_PROVIDER_SITE_OTHER): Payer: BLUE CROSS/BLUE SHIELD | Admitting: Specialist

## 2017-01-22 ENCOUNTER — Ambulatory Visit (INDEPENDENT_AMBULATORY_CARE_PROVIDER_SITE_OTHER): Payer: Self-pay

## 2017-01-22 ENCOUNTER — Encounter (INDEPENDENT_AMBULATORY_CARE_PROVIDER_SITE_OTHER): Payer: Self-pay | Admitting: Specialist

## 2017-01-22 VITALS — BP 138/79 | HR 74 | Ht 74.0 in | Wt 185.0 lb

## 2017-01-22 DIAGNOSIS — M6751 Plica syndrome, right knee: Secondary | ICD-10-CM | POA: Diagnosis not present

## 2017-01-22 DIAGNOSIS — M25561 Pain in right knee: Secondary | ICD-10-CM

## 2017-01-22 MED ORDER — LIDOCAINE HCL 1 % IJ SOLN
3.0000 mL | INTRAMUSCULAR | Status: AC | PRN
  Administered 2017-01-22: 3 mL

## 2017-01-22 MED ORDER — BUPIVACAINE HCL 0.25 % IJ SOLN
6.0000 mL | INTRAMUSCULAR | Status: AC | PRN
  Administered 2017-01-22: 6 mL via INTRA_ARTICULAR

## 2017-01-22 MED ORDER — METHYLPREDNISOLONE ACETATE 40 MG/ML IJ SUSP
80.0000 mg | INTRAMUSCULAR | Status: AC | PRN
  Administered 2017-01-22: 80 mg

## 2017-01-22 NOTE — Progress Notes (Signed)
Office Visit Note   Patient: Casey Allen           Date of Birth: 1971/01/27           MRN: [id number] Visit Date: 01/22/2017              Requested by: Laurey Morale, MD [place], [place] [address] PCP: Alysia Penna, MD   Assessment & Plan: Visit Diagnoses:  1. Acute pain of right knee   2. Plica syndrome of knee, right     Plan: I elected to treat conservatively with injection. Tolerated well without complication. I advised him to continue oral NSAID as directed and icing  the knee off and on. He will hold off from participating in martial arts for the next couple of days and then resume regular activities after that. Follow-up in the office as needed. I do routinely see patient outside of the clinic and he will let me know how he is feeling. If he continues to have ongoing symptoms we may consider getting a right knee MRI in a couple of weeks to rule out meniscus tear.  By history and exam he is sounding more like a symptomatic medial plica and hopefully he will get better after today's injection. I did give patient some literature to read. All questions answered.   Follow-Up Instructions: Return if symptoms worsen or fail to improve.   Orders:  Orders Placed This Encounter  Procedures  . Large Joint Injection/Arthrocentesis  . XR Knee 1-2 Views Right   No orders of the defined types were placed in this encounter.     Procedures: Large Joint Inj Date/Time: 01/22/2017 9:43 AM Performed by: Lanae Crumbly Authorized by: Lanae Crumbly   Consent Given by:  Patient Timeout: prior to procedure the correct patient, procedure, and site was verified   Indications:  Pain Location:  Knee Site:  R knee Prep: patient was prepped and draped in usual sterile fashion   Needle Size:  25 G Needle Length:  1.5 inches Approach:  Anteromedial Ultrasound Guidance: No   Fluoroscopic Guidance: No   Arthrogram: No   Medications:  3 mL lidocaine 1 %; 80 mg  methylPREDNISolone acetate 40 MG/ML; 6 mL bupivacaine 0.25 % Aspiration Attempted: No   Patient tolerance:  Patient tolerated the procedure well with no immediate complications     Clinical Data: No additional findings.   Subjective: Chief Complaint  Patient presents with  . Right Knee - Pain, Injury    DOI:01/05/2017    [name] is here for right knee pain.  He states that he injured it when was kicking and deflected and it popped.  He states that when his lower leg goes laterally his medial knee hurts.  His date of injury was 01/05/2017.   Injury    Patient is well-known to me. States that on the date of injury he was in tae kwon do class and while sparring he was kicking with his right leg and his opponent came in causing a forceful flexion and valgus type stress to the right knee. Complain of some soreness around the medial femoral condyle. Has been able to resume his activities but has soreness in the knee with flexion. He is okay with pivoting maneuvers feels some catching around the medial femoral condyle. No pain at the joint line. Denies instability. At times he feels like his knee is stiff but denies obvious swelling. Has tried conservative treatment with oral NSAIDs ice  Rest and activity modification.   Review of Systems  Constitutional: Positive for activity change.  HENT: Negative.   Respiratory: Negative.   Skin: Negative.   Neurological: Negative.   Psychiatric/Behavioral: Negative.      Objective: Vital Signs: BP 138/79 (BP Location: Left Arm, Patient Position: Sitting)   Pulse 74   Ht 6\' 2"  (1.88 m)   Wt 185 lb (83.9 kg)   BMI 23.75 kg/m   Physical Exam  Constitutional: He is oriented to person, place, and time. He appears well-developed and well-nourished. No distress.  HENT:  Head: Normocephalic and atraumatic.  Nose: Nose normal.  Mouth/Throat: Oropharynx is clear and moist.  Eyes: EOM are normal. Pupils are equal, round, and reactive to light.    Neck: Normal range of motion.  Pulmonary/Chest: No respiratory distress.  Abdominal: He exhibits no distension.  Musculoskeletal:  Gait is normal. Negative logroll bilateral hips. Negative straight leg raise. Right knee minimal swelling. Cruciate and collateral ligaments are stable. Medial lateral joint line nontender. McMurray's test causes discomfort up in the area of the medial femoral condyle/VMO. Superior medial plica is tender. Negative patellar apprehension. Negative patellar grind. Mild patellofemoral crepitus. Left knee unremarkable. Bilateral calves nontender. Neurovascularly intact.  Neurological: He is alert and oriented to person, place, and time.  Skin: Skin is warm and dry.  Psychiatric: He has a normal mood and affect.    Ortho Exam  Specialty Comments:  No specialty comments available.  Imaging: Xr Knee 1-2 Views Right  Result Date: 01/22/2017 X-ray right knee shows mild tricompartmental narrowing. No acute findings. No bony lesions.    PMFS History: Patient Active Problem List   Diagnosis Date Noted  . HTN (hypertension) 06/09/2014  . INGROWN NAIL 10/14/2010  . PARONYCHIA 06/10/2010  . TINEA CRURIS 05/01/2009  . LIPOMA 09/18/2008  . ALLERGIC RHINITIS 07/28/2008  . GERD 07/28/2008  . HEADACHE 07/28/2008  . NEPHROLITHIASIS, HX OF 07/28/2008   Past Medical History:  Diagnosis Date  . Allergic rhinitis   . GERD (gastroesophageal reflux disease)   . Headache(784.0)   . History of chickenpox   . History of nephrolithiasis   . Hypertension   . Skin exam, screening for cancer    sees Dr. Annamary Carolin     Family History  Problem Relation Age of Onset  . Hypertension    . Lung cancer    . Hypertension Father     Past Surgical History:  Procedure Laterality Date  . 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 Corrie Mckusick and then had MOHS on 2/11/110 per DR. Albertini    . rt achilles tendon repair  09/2000   Social  History   Occupational History  . Not on file.   Social History Main Topics  . Smoking status: Never Smoker  . Smokeless tobacco: Never Used  . Alcohol use 0.0 oz/week     Comment: 5-6 beers per week  . Drug use: No  . Sexual activity: Not on file

## 2017-02-20 ENCOUNTER — Encounter: Payer: Self-pay | Admitting: Family Medicine

## 2017-02-20 NOTE — Telephone Encounter (Signed)
Call in Claritin D 24 hour to take once daily, #30 with 5 rf

## 2017-02-23 NOTE — Telephone Encounter (Signed)
See my answer from last week

## 2017-05-25 ENCOUNTER — Encounter: Payer: Self-pay | Admitting: Family Medicine

## 2017-05-25 ENCOUNTER — Ambulatory Visit (INDEPENDENT_AMBULATORY_CARE_PROVIDER_SITE_OTHER): Payer: BLUE CROSS/BLUE SHIELD | Admitting: Family Medicine

## 2017-05-25 VITALS — BP 130/78 | HR 98 | Temp 98.1°F | Ht 74.0 in | Wt 191.2 lb

## 2017-05-25 DIAGNOSIS — Z0000 Encounter for general adult medical examination without abnormal findings: Secondary | ICD-10-CM

## 2017-05-25 LAB — CBC WITH DIFFERENTIAL/PLATELET
BASOS ABS: 0 10*3/uL (ref 0.0–0.1)
Basophils Relative: 0.9 % (ref 0.0–3.0)
EOS ABS: 0.1 10*3/uL (ref 0.0–0.7)
Eosinophils Relative: 3 % (ref 0.0–5.0)
HCT: 42.2 % (ref 39.0–52.0)
Hemoglobin: 13.9 g/dL (ref 13.0–17.0)
LYMPHS ABS: 1.4 10*3/uL (ref 0.7–4.0)
Lymphocytes Relative: 29.8 % (ref 12.0–46.0)
MCHC: 32.9 g/dL (ref 30.0–36.0)
MCV: 96.4 fl (ref 78.0–100.0)
Monocytes Absolute: 0.4 10*3/uL (ref 0.1–1.0)
Monocytes Relative: 8.8 % (ref 3.0–12.0)
NEUTROS ABS: 2.8 10*3/uL (ref 1.4–7.7)
NEUTROS PCT: 57.5 % (ref 43.0–77.0)
PLATELETS: 220 10*3/uL (ref 150.0–400.0)
RBC: 4.38 Mil/uL (ref 4.22–5.81)
RDW: 13 % (ref 11.5–15.5)
WBC: 4.8 10*3/uL (ref 4.0–10.5)

## 2017-05-25 LAB — BASIC METABOLIC PANEL
BUN: 16 mg/dL (ref 6–23)
CALCIUM: 9.3 mg/dL (ref 8.4–10.5)
CO2: 29 mEq/L (ref 19–32)
Chloride: 103 mEq/L (ref 96–112)
Creatinine, Ser: 1.38 mg/dL (ref 0.40–1.50)
GFR: 58.43 mL/min — AB (ref >60.00)
Glucose, Bld: 104 mg/dL — ABNORMAL HIGH (ref 70–99)
POTASSIUM: 4.1 meq/L (ref 3.5–5.1)
SODIUM: 140 meq/L (ref 135–145)

## 2017-05-25 LAB — POC URINALSYSI DIPSTICK (AUTOMATED)
Bilirubin, UA: NEGATIVE
Blood, UA: NEGATIVE
GLUCOSE UA: NEGATIVE
Ketones, UA: NEGATIVE
Leukocytes, UA: NEGATIVE
NITRITE UA: NEGATIVE
PH UA: 6 (ref 5.0–8.0)
Protein, UA: NEGATIVE
Spec Grav, UA: 1.01 (ref 1.010–1.025)
UROBILINOGEN UA: 0.2 U/dL

## 2017-05-25 LAB — LIPID PANEL
Cholesterol: 217 mg/dL — ABNORMAL HIGH (ref 0–200)
HDL: 80.8 mg/dL (ref >39.00)
LDL Cholesterol: 123 mg/dL — ABNORMAL HIGH (ref 0–99)
NonHDL: 135.83
Total CHOL/HDL Ratio: 3
Triglycerides: 66 mg/dL (ref 0.0–149.0)
VLDL: 13.2 mg/dL (ref 0.0–40.0)

## 2017-05-25 LAB — HEPATIC FUNCTION PANEL
ALK PHOS: 35 U/L — AB (ref 39–117)
ALT: 12 U/L (ref 0–53)
AST: 17 U/L (ref 0–37)
Albumin: 4.5 g/dL (ref 3.5–5.2)
Bilirubin, Direct: 0.1 mg/dL (ref 0.0–0.3)
Total Bilirubin: 0.8 mg/dL (ref 0.2–1.2)
Total Protein: 6.6 g/dL (ref 6.0–8.3)

## 2017-05-25 LAB — TSH: TSH: 0.39 u[IU]/mL (ref 0.35–4.50)

## 2017-05-25 NOTE — Progress Notes (Signed)
   Subjective:    Patient ID: Casey Allen, male    DOB: 01/07/1971, 46 y.o.   MRN: [id number]  HPI Here for a well exam. He feels fine.    Review of Systems  Constitutional: Negative.   HENT: Negative.   Eyes: Negative.   Respiratory: Negative.   Cardiovascular: Negative.   Gastrointestinal: Negative.   Genitourinary: Negative.   Musculoskeletal: Negative.   Skin: Negative.   Neurological: Negative.   Psychiatric/Behavioral: Negative.        Objective:   Physical Exam  Constitutional: He is oriented to person, place, and time. He appears well-developed and well-nourished. No distress.  HENT:  Head: Normocephalic and atraumatic.  Right Ear: External ear normal.  Left Ear: External ear normal.  Nose: Nose normal.  Mouth/Throat: Oropharynx is clear and moist. No oropharyngeal exudate.  Eyes: Pupils are equal, round, and reactive to light. Conjunctivae and EOM are normal. Right eye exhibits no discharge. Left eye exhibits no discharge. No scleral icterus.  Neck: Neck supple. No JVD present. No tracheal deviation present. No thyromegaly present.  Cardiovascular: Normal rate, regular rhythm, normal heart sounds and intact distal pulses.  Exam reveals no gallop and no friction rub.   No murmur heard. Pulmonary/Chest: Effort normal and breath sounds normal. No respiratory distress. He has no wheezes. He has no rales. He exhibits no tenderness.  Abdominal: Soft. Bowel sounds are normal. He exhibits no distension and no mass. There is no tenderness. There is no rebound and no guarding.  Genitourinary: Rectum normal, prostate normal and penis normal. Rectal exam shows guaiac negative stool. No penile tenderness.  Musculoskeletal: Normal range of motion. He exhibits no edema or tenderness.  Lymphadenopathy:    He has no cervical adenopathy.  Neurological: He is alert and oriented to person, place, and time. He has normal reflexes. No cranial nerve deficit. He exhibits normal muscle  tone. Coordination normal.  Skin: Skin is warm and dry. No rash noted. He is not diaphoretic. No erythema. No pallor.  Psychiatric: He has a normal mood and affect. His behavior is normal. Judgment and thought content normal.          Assessment & Plan:  Well exam. We discussed diet and exercise. Get fasting labs. Alysia Penna, MD

## 2017-07-23 ENCOUNTER — Encounter: Payer: Self-pay | Admitting: Family Medicine

## 2017-08-31 ENCOUNTER — Other Ambulatory Visit: Payer: Self-pay | Admitting: Family Medicine

## 2017-10-01 ENCOUNTER — Telehealth: Payer: Self-pay

## 2017-10-01 NOTE — Telephone Encounter (Signed)
90 day new refill request for loratadine-D 24 tablet 1 tablet daily. Sent to PCP for approval. Rx is not on pt's medication list.

## 2017-10-02 MED ORDER — LORATADINE-PSEUDOEPHEDRINE ER 5-120 MG PO TB12: 1.0000 | ORAL_TABLET | Freq: Once | ORAL | 3 refills | Status: AC

## 2017-10-02 NOTE — Telephone Encounter (Signed)
Rx was sent  

## 2017-10-02 NOTE — Telephone Encounter (Signed)
Call in Loratidine D 24 hour tabs to take one per day, #90 with 3 rf

## 2017-11-09 ENCOUNTER — Other Ambulatory Visit: Payer: Self-pay | Admitting: Family Medicine

## 2017-12-21 ENCOUNTER — Other Ambulatory Visit: Payer: Self-pay | Admitting: Family Medicine

## 2018-04-14 ENCOUNTER — Ambulatory Visit (INDEPENDENT_AMBULATORY_CARE_PROVIDER_SITE_OTHER): Payer: No Typology Code available for payment source | Admitting: Family Medicine

## 2018-04-14 ENCOUNTER — Encounter: Payer: Self-pay | Admitting: Family Medicine

## 2018-04-14 VITALS — BP 120/80 | HR 59 | Temp 97.8°F | Ht 73.0 in | Wt 188.0 lb

## 2018-04-14 DIAGNOSIS — Z0000 Encounter for general adult medical examination without abnormal findings: Secondary | ICD-10-CM | POA: Diagnosis not present

## 2018-04-14 DIAGNOSIS — J45990 Exercise induced bronchospasm: Secondary | ICD-10-CM

## 2018-04-14 LAB — CBC WITH DIFFERENTIAL/PLATELET
BASOS ABS: 0 10*3/uL (ref 0.0–0.1)
Basophils Relative: 0.9 % (ref 0.0–3.0)
Eosinophils Absolute: 0.1 10*3/uL (ref 0.0–0.7)
Eosinophils Relative: 2.2 % (ref 0.0–5.0)
HEMATOCRIT: 41.2 % (ref 39.0–52.0)
HEMOGLOBIN: 14.1 g/dL (ref 13.0–17.0)
LYMPHS PCT: 31.1 % (ref 12.0–46.0)
Lymphs Abs: 1.2 10*3/uL (ref 0.7–4.0)
MCHC: 34.1 g/dL (ref 30.0–36.0)
MCV: 94.2 fl (ref 78.0–100.0)
Monocytes Absolute: 0.4 10*3/uL (ref 0.1–1.0)
Monocytes Relative: 10.3 % (ref 3.0–12.0)
Neutro Abs: 2.1 10*3/uL (ref 1.4–7.7)
Neutrophils Relative %: 55.5 % (ref 43.0–77.0)
Platelets: 203 10*3/uL (ref 150.0–400.0)
RBC: 4.38 Mil/uL (ref 4.22–5.81)
RDW: 13 % (ref 11.5–15.5)
WBC: 3.8 10*3/uL — AB (ref 4.0–10.5)

## 2018-04-14 LAB — BASIC METABOLIC PANEL
BUN: 15 mg/dL (ref 6–23)
CALCIUM: 9.4 mg/dL (ref 8.4–10.5)
CO2: 30 mEq/L (ref 19–32)
CREATININE: 1.21 mg/dL (ref 0.40–1.50)
Chloride: 103 mEq/L (ref 96–112)
GFR: 67.75 mL/min (ref >60.00)
GLUCOSE: 108 mg/dL — AB (ref 70–99)
Potassium: 4.3 mEq/L (ref 3.5–5.1)
Sodium: 140 mEq/L (ref 135–145)

## 2018-04-14 LAB — LIPID PANEL
CHOLESTEROL: 212 mg/dL — AB (ref 0–200)
HDL: 73.6 mg/dL (ref >39.00)
LDL CALC: 127 mg/dL — AB (ref 0–99)
NonHDL: 138.31
Total CHOL/HDL Ratio: 3
Triglycerides: 58 mg/dL (ref 0.0–149.0)
VLDL: 11.6 mg/dL (ref 0.0–40.0)

## 2018-04-14 LAB — HEPATIC FUNCTION PANEL
ALBUMIN: 4.4 g/dL (ref 3.5–5.2)
ALK PHOS: 36 U/L — AB (ref 39–117)
ALT: 12 U/L (ref 0–53)
AST: 16 U/L (ref 0–37)
Bilirubin, Direct: 0.2 mg/dL (ref 0.0–0.3)
Total Bilirubin: 0.9 mg/dL (ref 0.2–1.2)
Total Protein: 6.6 g/dL (ref 6.0–8.3)

## 2018-04-14 LAB — POC URINALSYSI DIPSTICK (AUTOMATED)
Bilirubin, UA: NEGATIVE
Blood, UA: NEGATIVE
GLUCOSE UA: NEGATIVE
Ketones, UA: NEGATIVE
Leukocytes, UA: NEGATIVE
NITRITE UA: NEGATIVE
PH UA: 7 (ref 5.0–8.0)
PROTEIN UA: NEGATIVE
Spec Grav, UA: 1.01 (ref 1.010–1.025)
UROBILINOGEN UA: 0.2 U/dL

## 2018-04-14 LAB — TSH: TSH: 0.47 u[IU]/mL (ref 0.35–4.50)

## 2018-04-14 MED ORDER — ALBUTEROL SULFATE HFA 108 (90 BASE) MCG/ACT IN AERS: 2.0000 | INHALATION_SPRAY | RESPIRATORY_TRACT | 5 refills | Status: DC | PRN

## 2018-04-14 NOTE — Progress Notes (Signed)
   Subjective:    Patient ID: Casey Allen, male    DOB: 01-27-1971, 47 y.o.   MRN: [id number]  HPI Here for a well exam. He feels great.    Review of Systems  Constitutional: Negative.   HENT: Negative.   Eyes: Negative.   Respiratory: Negative.   Cardiovascular: Negative.   Gastrointestinal: Negative.   Genitourinary: Negative.   Musculoskeletal: Negative.   Skin: Negative.   Neurological: Negative.   Psychiatric/Behavioral: Negative.        Objective:   Physical Exam  Constitutional: He is oriented to person, place, and time. He appears well-developed and well-nourished. No distress.  HENT:  Head: Normocephalic and atraumatic.  Right Ear: External ear normal.  Left Ear: External ear normal.  Nose: Nose normal.  Mouth/Throat: Oropharynx is clear and moist. No oropharyngeal exudate.  Eyes: Pupils are equal, round, and reactive to light. Conjunctivae and EOM are normal. Right eye exhibits no discharge. Left eye exhibits no discharge. No scleral icterus.  Neck: Neck supple. No JVD present. No tracheal deviation present. No thyromegaly present.  Cardiovascular: Normal rate, regular rhythm, normal heart sounds and intact distal pulses. Exam reveals no gallop and no friction rub.  No murmur heard. Pulmonary/Chest: Effort normal and breath sounds normal. No respiratory distress. He has no wheezes. He has no rales. He exhibits no tenderness.  Abdominal: Soft. Bowel sounds are normal. He exhibits no distension and no mass. There is no tenderness. There is no rebound and no guarding.  Genitourinary: Penis normal. No penile tenderness.  Musculoskeletal: Normal range of motion. He exhibits no edema or tenderness.  Lymphadenopathy:    He has no cervical adenopathy.  Neurological: He is alert and oriented to person, place, and time. He has normal reflexes. He displays normal reflexes. No cranial nerve deficit. He exhibits normal muscle tone. Coordination normal.  Skin: Skin is warm  and dry. No rash noted. He is not diaphoretic. No erythema. No pallor.  Psychiatric: He has a normal mood and affect. His behavior is normal. Judgment and thought content normal.          Assessment & Plan:  Well exam. We discussed diet and exercise. Get fasting labs.  Alysia Penna, MD

## 2018-05-03 ENCOUNTER — Other Ambulatory Visit: Payer: Self-pay | Admitting: Family Medicine

## 2018-09-14 ENCOUNTER — Other Ambulatory Visit: Payer: Self-pay | Admitting: Family Medicine

## 2018-09-21 NOTE — Telephone Encounter (Signed)
Pt calling to check on the status of this.  States that pharmacy hasn't heard back from the office.

## 2018-09-21 NOTE — Telephone Encounter (Signed)
Requested medication (s) are due for refill today: yes  Requested medication (s) are on the active medication list: yes  Last refill:  08/31/17 #30 with 11 refills  Future visit scheduled: yes  Notes to clinic:  Unable to fill per protocol    Requested Prescriptions  Pending Prescriptions Disp Refills   [facility] ALLERGY RELIEF-D 10-240 MG 24 hr tablet [Pharmacy Med Name: [facility] LORATADINE-D 24HR TABLET] 90 tablet 3    Sig: TAKE 1 TABLET EVERY DAY     Not Delegated - Ear, Nose, and Throat:  Combination Products with Pseudoephedrine Failed - 09/21/2018  3:13 PM      Failed - This refill cannot be delegated      Passed - Last BP in normal range    BP Readings from Last 1 Encounters:  04/14/18 120/80         Passed - Valid encounter within last 12 months    Recent Outpatient Visits          5 months ago Preventative health care   [facility] [place], Ishmael Holter, MD   1 year ago Preventative health care   [facility] at [place], Ishmael Holter, MD   1 year ago Preventative health care   [place] at [place], Ishmael Holter, MD   3 years ago Preventative health care   [facility] [facility] [place], Ishmael Holter, MD   3 years ago Essential hypertension   [facility] at [place], Ishmael Holter, MD

## 2018-10-20 ENCOUNTER — Other Ambulatory Visit: Payer: Self-pay | Admitting: Family Medicine

## 2018-12-11 ENCOUNTER — Other Ambulatory Visit: Payer: Self-pay | Admitting: Family Medicine

## 2019-05-09 ENCOUNTER — Other Ambulatory Visit: Payer: Self-pay | Admitting: Family Medicine

## 2019-05-17 ENCOUNTER — Other Ambulatory Visit: Payer: Self-pay | Admitting: Family Medicine

## 2019-05-17 DIAGNOSIS — I10 Essential (primary) hypertension: Secondary | ICD-10-CM

## 2019-05-17 NOTE — Telephone Encounter (Signed)
REFILL lisinopril (ZESTRIL) 20 MG tablet [Pharmacy Med Name: LISINOPRIL 20 MG  [facility] [id number] [place] [address], [place] [phone] (Phone) [phone] (Fax)

## 2019-05-18 MED ORDER — LISINOPRIL 20 MG PO TABS: 20.0000 mg | ORAL_TABLET | Freq: Every day | ORAL | 0 refills | Status: DC

## 2019-05-18 NOTE — Telephone Encounter (Signed)
Rx refilled only for 30 days patient has not been since 04/2018

## 2019-06-10 ENCOUNTER — Telehealth: Payer: Self-pay | Admitting: Family Medicine

## 2019-06-10 DIAGNOSIS — I10 Essential (primary) hypertension: Secondary | ICD-10-CM

## 2019-06-27 ENCOUNTER — Encounter: Payer: Self-pay | Admitting: Family Medicine

## 2019-06-27 ENCOUNTER — Telehealth (INDEPENDENT_AMBULATORY_CARE_PROVIDER_SITE_OTHER): Payer: Self-pay | Admitting: Family Medicine

## 2019-06-27 ENCOUNTER — Other Ambulatory Visit: Payer: Self-pay

## 2019-06-27 DIAGNOSIS — I10 Essential (primary) hypertension: Secondary | ICD-10-CM

## 2019-06-27 DIAGNOSIS — J45990 Exercise induced bronchospasm: Secondary | ICD-10-CM

## 2019-06-27 MED ORDER — ALBUTEROL SULFATE HFA 108 (90 BASE) MCG/ACT IN AERS: 2.0000 | INHALATION_SPRAY | RESPIRATORY_TRACT | 5 refills | Status: DC | PRN

## 2019-06-27 MED ORDER — LISINOPRIL 20 MG PO TABS: 20.0000 mg | ORAL_TABLET | Freq: Every day | ORAL | 0 refills | Status: DC

## 2019-06-27 NOTE — Progress Notes (Signed)
Virtual Visit via Video Note  I connected with the patient on 06/27/19 at  2:30 PM EDT by a video enabled telemedicine application and verified that I am speaking with the correct person using two identifiers.  Location patient: home Location provider:work or home office Persons participating in the virtual visit: patient, provider  I discussed the limitations of evaluation and management by telemedicine and the availability of in person appointments. The patient expressed understanding and agreed to proceed.   HPI: Here for refills. He was last seen in June of 2019. He feels fine, but he has not checked his BP in over a year. He runs 4-5 days a week.    ROS: See pertinent positives and negatives per HPI.  Past Medical History:  Diagnosis Date  . Allergic rhinitis   . Asthma   . GERD (gastroesophageal reflux disease)   . Headache(784.0)   . History of chickenpox   . History of nephrolithiasis   . Hypertension   . Skin exam, screening for cancer    sees Dr. Annamary Carolin     Past Surgical History:  Procedure Laterality Date  . 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 Corrie Mckusick and then had MOHS on 2/11/110 per DR. Albertini    . rt achilles tendon repair  09/2000    Family History  Problem Relation Age of Onset  . Hypertension Unknown   . Lung cancer Unknown   . Hypertension Father      Current Outpatient Medications:  .  albuterol (VENTOLIN HFA) 108 (90 Base) MCG/ACT inhaler, Inhale 2 puffs into the lungs every 4 (four) hours as needed for wheezing or shortness of breath (before exercise)., Disp: 18 g, Rfl: 5 .  amLODipine (NORVASC) 10 MG tablet, TAKE 1 TABLET (10 MG TOTAL) BY MOUTH DAILY., Disp: 90 tablet, Rfl: 3 .  [facility] ALLERGY RELIEF-D 10-240 MG 24 hr tablet, TAKE 1 TABLET EVERY DAY, Disp: 90 tablet, Rfl: 3 .  Fish Oil OIL, by Does not apply route., Disp: , Rfl:  .  fluticasone (FLONASE) 50 MCG/ACT nasal spray, USE TWO SPRAYS IN  EACH NOSTRIL DAILY , Disp: 16 g, Rfl: 2 .  lisinopril (ZESTRIL) 20 MG tablet, Take 1 tablet (20 mg total) by mouth daily., Disp: 90 tablet, Rfl: 0 .  Multiple Vitamin (MULTIVITAMIN) tablet, Take 1 tablet by mouth daily., Disp: , Rfl:  .  Vitamin D, Cholecalciferol, 1000 UNITS CAPS, Take by mouth daily., Disp: , Rfl:   EXAM:  VITALS per patient if applicable:  GENERAL: alert, oriented, appears well and in no acute distress  HEENT: atraumatic, conjunttiva clear, no obvious abnormalities on inspection of external nose and ears  NECK: normal movements of the head and neck  LUNGS: on inspection no signs of respiratory distress, breathing rate appears normal, no obvious gross SOB, gasping or wheezing  CV: no obvious cyanosis  MS: moves all visible extremities without noticeable abnormality  PSYCH/NEURO: pleasant and cooperative, no obvious depression or anxiety, speech and thought processing grossly intact  ASSESSMENT AND PLAN: HTN and asthma, probably stable. Refills were sent in. I asked him to set up a well exam with labs soon so we can check his BP.  Alysia Penna, MD  Discussed the following assessment and plan:  Essential hypertension - Plan: lisinopril (ZESTRIL) 20 MG tablet     I discussed the assessment and treatment plan with the patient. The patient was provided an opportunity to ask questions and all were answered. The patient  agreed with the plan and demonstrated an understanding of the instructions.   The patient was advised to call back or seek an in-person evaluation if the symptoms worsen or if the condition fails to improve as anticipated.

## 2019-06-27 NOTE — Telephone Encounter (Signed)
Pt called and stated that he does not want to come in for an appointment because of covid. Pt states that he only has two pill left and will need them called in asap. Pt states that this is his blood pressure medication and he could die.

## 2019-06-27 NOTE — Telephone Encounter (Signed)
Noted. Dr. Sarajane Jews will handle refills during the visit.

## 2019-06-27 NOTE — Telephone Encounter (Signed)
Pt was called and scheduled with Dr. Sarajane Jews for a virtual at 2:30.

## 2019-06-27 NOTE — Telephone Encounter (Signed)
Pt has not been seen in greater than one year. Pt can schedule virtual visit if not wanting to come in to office. Once appt scheduled please send message to [facility] for refill to be sent in.   [place]

## 2019-09-15 ENCOUNTER — Other Ambulatory Visit: Payer: Self-pay | Admitting: Family Medicine

## 2019-09-15 DIAGNOSIS — I10 Essential (primary) hypertension: Secondary | ICD-10-CM

## 2019-11-01 ENCOUNTER — Encounter: Payer: Self-pay | Admitting: Family Medicine

## 2019-11-10 ENCOUNTER — Encounter: Payer: Self-pay | Admitting: Family Medicine

## 2019-11-10 ENCOUNTER — Encounter: Payer: Self-pay | Admitting: Gastroenterology

## 2019-11-10 ENCOUNTER — Ambulatory Visit (INDEPENDENT_AMBULATORY_CARE_PROVIDER_SITE_OTHER): Payer: No Typology Code available for payment source | Admitting: Family Medicine

## 2019-11-10 ENCOUNTER — Other Ambulatory Visit: Payer: Self-pay

## 2019-11-10 VITALS — BP 124/60 | HR 68 | Temp 98.0°F | Ht 73.0 in | Wt 197.6 lb

## 2019-11-10 DIAGNOSIS — Z0000 Encounter for general adult medical examination without abnormal findings: Secondary | ICD-10-CM

## 2019-11-10 LAB — TSH: TSH: 0.46 u[IU]/mL (ref 0.35–4.50)

## 2019-11-10 LAB — CBC WITH DIFFERENTIAL/PLATELET
Basophils Absolute: 0 10*3/uL (ref 0.0–0.1)
Basophils Relative: 0.8 % (ref 0.0–3.0)
Eosinophils Absolute: 0 10*3/uL (ref 0.0–0.7)
Eosinophils Relative: 0.5 % (ref 0.0–5.0)
HCT: 42.9 % (ref 39.0–52.0)
Hemoglobin: 14.3 g/dL (ref 13.0–17.0)
Lymphocytes Relative: 22.6 % (ref 12.0–46.0)
Lymphs Abs: 1.2 10*3/uL (ref 0.7–4.0)
MCHC: 33.3 g/dL (ref 30.0–36.0)
MCV: 96.4 fl (ref 78.0–100.0)
Monocytes Absolute: 0.4 10*3/uL (ref 0.1–1.0)
Monocytes Relative: 7.1 % (ref 3.0–12.0)
Neutro Abs: 3.8 10*3/uL (ref 1.4–7.7)
Neutrophils Relative %: 69 % (ref 43.0–77.0)
Platelets: 221 10*3/uL (ref 150.0–400.0)
RBC: 4.44 Mil/uL (ref 4.22–5.81)
RDW: 13.1 % (ref 11.5–15.5)
WBC: 5.5 10*3/uL (ref 4.0–10.5)

## 2019-11-10 LAB — BASIC METABOLIC PANEL
BUN: 13 mg/dL (ref 6–23)
CO2: 31 mEq/L (ref 19–32)
Calcium: 9.4 mg/dL (ref 8.4–10.5)
Chloride: 102 mEq/L (ref 96–112)
Creatinine, Ser: 1.18 mg/dL (ref 0.40–1.50)
GFR: 65.2 mL/min (ref >60.00)
Glucose, Bld: 112 mg/dL — ABNORMAL HIGH (ref 70–99)
Potassium: 4.4 mEq/L (ref 3.5–5.1)
Sodium: 140 mEq/L (ref 135–145)

## 2019-11-10 LAB — LIPID PANEL
Cholesterol: 224 mg/dL — ABNORMAL HIGH (ref 0–200)
HDL: 90.4 mg/dL (ref >39.00)
LDL Cholesterol: 121 mg/dL — ABNORMAL HIGH (ref 0–99)
NonHDL: 133.28
Total CHOL/HDL Ratio: 2
Triglycerides: 61 mg/dL (ref 0.0–149.0)
VLDL: 12.2 mg/dL (ref 0.0–40.0)

## 2019-11-10 LAB — PSA: PSA: 5.88 ng/mL — ABNORMAL HIGH (ref 0.10–4.00)

## 2019-11-10 LAB — HEPATIC FUNCTION PANEL
ALT: 9 U/L (ref 0–53)
AST: 14 U/L (ref 0–37)
Albumin: 4.5 g/dL (ref 3.5–5.2)
Alkaline Phosphatase: 40 U/L (ref 39–117)
Bilirubin, Direct: 0.2 mg/dL (ref 0.0–0.3)
Total Bilirubin: 0.9 mg/dL (ref 0.2–1.2)
Total Protein: 6.9 g/dL (ref 6.0–8.3)

## 2019-11-10 MED ORDER — LOSARTAN POTASSIUM 50 MG PO TABS: 50.0000 mg | ORAL_TABLET | Freq: Every day | ORAL | 3 refills | Status: DC

## 2019-11-10 MED ORDER — AMLODIPINE BESYLATE 10 MG PO TABS: 10.0000 mg | ORAL_TABLET | Freq: Every day | ORAL | 3 refills | Status: DC

## 2019-11-10 MED ORDER — AZELASTINE HCL 0.1 % NA SOLN: 2.0000 | Freq: Two times a day (BID) | NASAL | 12 refills | Status: DC

## 2019-11-10 NOTE — Progress Notes (Signed)
Subjective:    Patient ID: Casey Allen, male    DOB: 19-Apr-1971, 49 y.o.   MRN: [id number]  HPI Here for a well exam. He has a few issues to discuss. First he has had a chronic cough for the past year. This waxes and wanes but is present every day. This originates in the back of the throat and does not involve the chest. It is non-productive and he has no chest pain. He goes running 4-5 days a week, and he has felt a little more SOB than usual. He premedicates with his albuterol inhaler before workouts. Also he has constant nasal congestion despite using Flonase and Claritin D daily.    Review of Systems  Constitutional: Negative.   HENT: Positive for congestion.   Eyes: Negative.   Respiratory: Positive for cough.   Cardiovascular: Negative.   Gastrointestinal: Negative.   Genitourinary: Negative.   Musculoskeletal: Negative.   Skin: Negative.   Neurological: Negative.   Psychiatric/Behavioral: Negative.        Objective:   Physical Exam Constitutional:      General: He is not in acute distress.    Appearance: He is well-developed. He is not diaphoretic.  HENT:     Head: Normocephalic and atraumatic.     Right Ear: External ear normal.     Left Ear: External ear normal.     Nose: Nose normal.     Mouth/Throat:     Pharynx: No oropharyngeal exudate.  Eyes:     General: No scleral icterus.       Right eye: No discharge.        Left eye: No discharge.     Conjunctiva/sclera: Conjunctivae normal.     Pupils: Pupils are equal, round, and reactive to light.  Neck:     Thyroid: No thyromegaly.     Vascular: No JVD.     Trachea: No tracheal deviation.  Cardiovascular:     Rate and Rhythm: Normal rate and regular rhythm.     Heart sounds: Normal heart sounds. No murmur. No friction rub. No gallop.   Pulmonary:     Effort: Pulmonary effort is normal. No respiratory distress.     Breath sounds: Normal breath sounds. No wheezing or rales.  Chest:     Chest wall: No  tenderness.  Abdominal:     General: Bowel sounds are normal. There is no distension.     Palpations: Abdomen is soft. There is no mass.     Tenderness: There is no abdominal tenderness. There is no guarding or rebound.  Genitourinary:    Penis: Normal. No tenderness.      Prostate: Normal.     Rectum: Normal. Guaiac result negative.  Musculoskeletal:        General: No tenderness. Normal range of motion.     Cervical back: Neck supple.  Lymphadenopathy:     Cervical: No cervical adenopathy.  Skin:    General: Skin is warm and dry.     Coloration: Skin is not pale.     Findings: No erythema or rash.  Neurological:     Mental Status: He is alert and oriented to person, place, and time.     Cranial Nerves: No cranial nerve deficit.     Motor: No abnormal muscle tone.     Coordination: Coordination normal.     Deep Tendon Reflexes: Reflexes are normal and symmetric. Reflexes normal.  Psychiatric:        Behavior: Behavior normal.  Thought Content: Thought content normal.        Judgment: Judgment normal.           Assessment & Plan:  Well exam. We discussed diet and exercise. Get fasting labs. His cough is likely a side effect of the Lisinopril, so we will stop this. Instead he will take Losartan 50 mg daily. For the nasal congestion we will add Azelastine sprays BID. Set up his first colonoscopy.  Alysia Penna, MD

## 2019-11-15 ENCOUNTER — Encounter: Payer: Self-pay | Admitting: Family Medicine

## 2019-11-15 NOTE — Telephone Encounter (Signed)
I would like him to stay on the Losartan a little longer, if he can tolerate it. Things should be settled down after 3-4 weeks

## 2019-11-16 NOTE — Telephone Encounter (Signed)
Noted. Hopefully the headaches will stop soon

## 2019-11-23 ENCOUNTER — Ambulatory Visit (AMBULATORY_SURGERY_CENTER): Payer: Self-pay | Admitting: *Deleted

## 2019-11-23 ENCOUNTER — Other Ambulatory Visit: Payer: Self-pay

## 2019-11-23 VITALS — Temp 97.8°F | Ht 73.0 in | Wt 199.4 lb

## 2019-11-23 DIAGNOSIS — Z01818 Encounter for other preprocedural examination: Secondary | ICD-10-CM

## 2019-11-23 DIAGNOSIS — Z1211 Encounter for screening for malignant neoplasm of colon: Secondary | ICD-10-CM

## 2019-11-23 MED ORDER — SUPREP BOWEL PREP KIT 17.5-3.13-1.6 GM/177ML PO SOLN: ORAL | 0 refills | Status: DC

## 2019-11-23 NOTE — Progress Notes (Signed)
Pt is aware that care partner will wait in the car during procedure; if they feel like they will be too hot or cold to wait in the car; they may wait in the 4 th floor lobby. Patient is aware to bring only one care partner. We want them to wear a mask (we do not have any that we can provide them), practice social distancing, and we will check their temperatures when they get here.  I did remind the patient that their care partner needs to stay in the parking lot the entire time and have a cell phone available, we will call them when the pt is ready for discharge. Patient will wear mask into building.  No egg or soy allergy  No home oxygen use or problems with anesthesia  No medications for weight loss taken  emmi information given  $15 off Suprep coupon given  covid test 12-01-19 at 9:45 am

## 2019-11-30 ENCOUNTER — Encounter: Payer: Self-pay | Admitting: Gastroenterology

## 2019-12-01 ENCOUNTER — Other Ambulatory Visit: Payer: Self-pay | Admitting: Gastroenterology

## 2019-12-01 ENCOUNTER — Ambulatory Visit (INDEPENDENT_AMBULATORY_CARE_PROVIDER_SITE_OTHER): Payer: Self-pay

## 2019-12-01 DIAGNOSIS — Z1159 Encounter for screening for other viral diseases: Secondary | ICD-10-CM

## 2019-12-02 LAB — SARS CORONAVIRUS 2 (TAT 6-24 HRS): SARS Coronavirus 2: NEGATIVE

## 2019-12-06 ENCOUNTER — Encounter: Payer: Self-pay | Admitting: Gastroenterology

## 2019-12-06 ENCOUNTER — Ambulatory Visit (AMBULATORY_SURGERY_CENTER): Payer: No Typology Code available for payment source | Admitting: Gastroenterology

## 2019-12-06 ENCOUNTER — Other Ambulatory Visit: Payer: Self-pay

## 2019-12-06 VITALS — BP 128/84 | HR 78 | Temp 96.2°F | Resp 20 | Ht 73.0 in | Wt 199.0 lb

## 2019-12-06 DIAGNOSIS — Z1211 Encounter for screening for malignant neoplasm of colon: Secondary | ICD-10-CM | POA: Diagnosis not present

## 2019-12-06 DIAGNOSIS — K635 Polyp of colon: Secondary | ICD-10-CM

## 2019-12-06 DIAGNOSIS — D124 Benign neoplasm of descending colon: Secondary | ICD-10-CM | POA: Diagnosis not present

## 2019-12-06 DIAGNOSIS — D122 Benign neoplasm of ascending colon: Secondary | ICD-10-CM

## 2019-12-06 DIAGNOSIS — D123 Benign neoplasm of transverse colon: Secondary | ICD-10-CM

## 2019-12-06 MED ORDER — SODIUM CHLORIDE 0.9 % IV SOLN: 500.0000 mL | Freq: Once | INTRAVENOUS | Status: DC

## 2019-12-06 NOTE — Progress Notes (Signed)
Pt's states no medical or surgical changes since previsit or office visit. 

## 2019-12-06 NOTE — Op Note (Signed)
[facility] Patient Name: Casey Allen Procedure Date: 12/06/2019 10:52 AM MRN: [id number] Endoscopist: [place]. Loletha Carrow , MD Age: 49 Referring MD:  Date of Birth: [birth year] Gender: Male Account #: [id number] Procedure:                Colonoscopy Indications:              Screening for colorectal malignant neoplasm, This                            is the patient's first colonoscopy Medicines:                Monitored Anesthesia Care Procedure:                Pre-Anesthesia Assessment:                           - Prior to the procedure, a History and Physical                            was performed, and patient medications and                            allergies were reviewed. The patient's tolerance of                            previous anesthesia was also reviewed. The risks                            and benefits of the procedure and the sedation                            options and risks were discussed with the patient.                            All questions were answered, and informed consent                            was obtained. Prior Anticoagulants: The patient has                            taken no previous anticoagulant or antiplatelet                            agents. ASA Grade Assessment: II - A patient with                            mild systemic disease. After reviewing the risks                            and benefits, the patient was deemed in                            satisfactory condition to undergo the procedure.  After obtaining informed consent, the colonoscope                            was passed under direct vision. Throughout the                            procedure, the patient's blood pressure, pulse, and                            oxygen saturations were monitored continuously. The                            Colonoscope was introduced through the anus and                            advanced to the the terminal  ileum, with                            identification of the appendiceal orifice and IC                            valve. The colonoscopy was performed without                            difficulty. The patient tolerated the procedure                            well. The quality of the bowel preparation was                            good. The ileocecal valve, appendiceal orifice, and                            rectum were photographed. The quality of the bowel                            preparation was evaluated using the BBPS [facility]                            Bowel Preparation Scale) with scores of: Right                            Colon = 3, Transverse Colon = 2 and Left Colon = 2.                            The total BBPS score equals 7. The bowel                            preparation used was SUPREP. Scope In: 11:16:01 AM Scope Out: 11:36:56 AM Scope Withdrawal Time: 0 hours 16 minutes 30 seconds  Total Procedure Duration: 0 hours 20 minutes 55 seconds  Findings:                 The perianal  and digital rectal examinations were                            normal.                           The terminal ileum appeared normal.                           Two sessile polyps were found in the descending                            colon and ascending colon. The polyps were                            diminutive in size. These polyps were removed with                            a cold biopsy forceps. Resection and retrieval were                            complete.                           A 10 mm polyp was found in the transverse colon.                            The polyp was sessile with a mucus cap. The polyp                            was removed with a cold snare. Resection and                            retrieval were complete.                           Internal hemorrhoids were found.                           The exam was otherwise without abnormality on                             direct and retroflexion views. Complications:            No immediate complications. Estimated Blood Loss:     Estimated blood loss was minimal. Impression:               - The examined portion of the ileum was normal.                           - Two diminutive polyps in the descending colon and                            in the ascending colon, removed with a cold biopsy  forceps. Resected and retrieved.                           - One 10 mm polyp in the transverse colon, removed                            with a cold snare. Resected and retrieved.                           - Internal hemorrhoids.                           - The examination was otherwise normal on direct                            and retroflexion views. Recommendation:           - Patient has a contact number available for                            emergencies. The signs and symptoms of potential                            delayed complications were discussed with the                            patient. Return to normal activities tomorrow.                            Written discharge instructions were provided to the                            patient.                           - Resume previous diet.                           - Continue present medications.                           - Await pathology results.                           - Repeat colonoscopy is recommended for                            surveillance. The colonoscopy date will be                            determined after pathology results from today's                            exam become available for review. Estes Lehner L. Loletha Carrow, MD 12/06/2019 11:41:59 AM This report has been signed electronically.

## 2019-12-06 NOTE — Progress Notes (Signed)
Called to room to assist during endoscopic procedure.  Patient ID and intended procedure confirmed with present staff. Received instructions for my participation in the procedure from the performing physician.  

## 2019-12-06 NOTE — Patient Instructions (Signed)
YOU HAD AN ENDOSCOPIC PROCEDURE TODAY AT THE [facility]:   Refer to the procedure report that was given to you for any specific questions about what was found during the examination.  If the procedure report does not answer your questions, please call your gastroenterologist to clarify.  If you requested that your care partner not be given the details of your procedure findings, then the procedure report has been included in a sealed envelope for you to review at your convenience later.  YOU SHOULD EXPECT: Some feelings of bloating in the abdomen. Passage of more gas than usual.  Walking can help get rid of the air that was put into your GI tract during the procedure and reduce the bloating. If you had a lower endoscopy (such as a colonoscopy or flexible sigmoidoscopy) you may notice spotting of blood in your stool or on the toilet paper. If you underwent a bowel prep for your procedure, you may not have a normal bowel movement for a few days.  Please Note:  You might notice some irritation and congestion in your nose or some drainage.  This is from the oxygen used during your procedure.  There is no need for concern and it should clear up in a day or so.  SYMPTOMS TO REPORT IMMEDIATELY:   Following lower endoscopy (colonoscopy or flexible sigmoidoscopy):  Excessive amounts of blood in the stool  Significant tenderness or worsening of abdominal pains  Swelling of the abdomen that is new, acute  Fever of 100F or higher  For urgent or emergent issues, a gastroenterologist can be reached at any hour by calling [phone].   DIET:  We do recommend a small meal at first, but then you may proceed to your regular diet.  Drink plenty of fluids but you should avoid alcoholic beverages for 24 hours.  ACTIVITY:  You should plan to take it easy for the rest of today and you should NOT DRIVE or use heavy machinery until tomorrow (because of the sedation medicines used during the test).     FOLLOW UP: Our staff will call the number listed on your records 48-72 hours following your procedure to check on you and address any questions or concerns that you may have regarding the information given to you following your procedure. If we do not reach you, we will leave a message.  We will attempt to reach you two times.  During this call, we will ask if you have developed any symptoms of COVID 19. If you develop any symptoms (ie: fever, flu-like symptoms, shortness of breath, cough etc.) before then, please call [phone].  If you test positive for Covid 19 in the 2 weeks post procedure, please call and report this information to us.    If any biopsies were taken you will be contacted by phone or by letter within the next 1-3 weeks.  Please call us at [phone] if you have not heard about the biopsies in 3 weeks.    SIGNATURES/CONFIDENTIALITY: You and/or your care partner have signed paperwork which will be entered into your electronic medical record.  These signatures attest to the fact that that the information above on your After Visit Summary has been reviewed and is understood.  Full responsibility of the confidentiality of this discharge information lies with you and/or your care-partner. 

## 2019-12-06 NOTE — Progress Notes (Signed)
PT taken to PACU. Monitors in place. VSS. Report given to RN. 

## 2019-12-08 ENCOUNTER — Telehealth: Payer: Self-pay

## 2019-12-08 ENCOUNTER — Encounter: Payer: Self-pay | Admitting: Gastroenterology

## 2019-12-08 NOTE — Telephone Encounter (Signed)
  Follow up Call-  Call back number 12/06/2019  Post procedure Call Back phone  # [phone]  Permission to leave phone message Yes  Some recent data might be hidden     Patient questions:  Do you have a fever, pain , or abdominal swelling? No. Pain Score  0 *  Have you tolerated food without any problems? Yes.    Have you been able to return to your normal activities? Yes.    Do you have any questions about your discharge instructions: Diet   No. Medications  No. Follow up visit  No.  Do you have questions or concerns about your Care? No.  Actions: * If pain score is 4 or above: No action needed, pain <4.  1. Have you developed a fever since your procedure? no  2.   Have you had an respiratory symptoms (SOB or cough) since your procedure? no  3.   Have you tested positive for COVID 19 since your procedure no  4.   Have you had any family members/close contacts diagnosed with the COVID 19 since your procedure?  no   If yes to any of these questions please route to Joylene John, RN and Alphonsa Gin, Therapist, sports.

## 2020-01-19 ENCOUNTER — Ambulatory Visit: Payer: No Typology Code available for payment source | Attending: Internal Medicine

## 2020-01-19 DIAGNOSIS — Z23 Encounter for immunization: Secondary | ICD-10-CM

## 2020-01-19 NOTE — Progress Notes (Signed)
   Covid-19 Vaccination Clinic  Name:  Casey Allen    MRN: [id number] DOB: Sep 23, 1971  01/19/2020  Mr. [name] was observed post Covid-19 immunization for 15 minutes without incident. He was provided with Vaccine Information Sheet and instruction to access the V-Safe system.   Mr. [name] was instructed to call 911 with any severe reactions post vaccine: Marland Kitchen Difficulty breathing  . Swelling of face and throat  . A fast heartbeat  . A bad rash all over body  . Dizziness and weakness   Immunizations Administered    Name Date Dose VIS Date Route   Pfizer COVID-19 Vaccine 01/19/2020 11:29 AM 0.3 mL 10/14/2019 Intramuscular   Manufacturer: [place]   Lot: [id number]   [place]: [id number]

## 2020-02-13 ENCOUNTER — Ambulatory Visit: Payer: No Typology Code available for payment source | Attending: Internal Medicine

## 2020-02-13 DIAGNOSIS — Z23 Encounter for immunization: Secondary | ICD-10-CM

## 2020-02-13 NOTE — Progress Notes (Signed)
   Covid-19 Vaccination Clinic  Name:  Casey Allen    MRN: [id number] DOB: [birth year]  02/13/2020  [name] was observed post Covid-19 immunization for 15 minutes without incident. He was provided with Vaccine Information Sheet and instruction to access the V-Safe system.   [name] was instructed to call 911 with any severe reactions post vaccine: Marland Kitchen Difficulty breathing  . Swelling of face and throat  . A fast heartbeat  . A bad rash all over body  . Dizziness and weakness   Immunizations Administered    Name Date Dose VIS Date Route   Pfizer COVID-19 Vaccine 02/13/2020 10:26 AM 0.3 mL 10/14/2019 Intramuscular   Manufacturer: [employer], [employer]   Lot: [id number]   [place]: [id number]

## 2020-02-20 ENCOUNTER — Other Ambulatory Visit: Payer: Self-pay | Admitting: Family Medicine

## 2020-07-17 ENCOUNTER — Encounter: Payer: Self-pay | Admitting: Family Medicine

## 2020-07-18 MED ORDER — LISINOPRIL 20 MG PO TABS: 20.0000 mg | ORAL_TABLET | Freq: Every day | ORAL | 3 refills | Status: DC

## 2020-07-18 NOTE — Telephone Encounter (Signed)
Stop Losartan and call in Lisinopril 20 mg daily #90 with 3 rf

## 2020-08-16 ENCOUNTER — Other Ambulatory Visit: Payer: Self-pay | Admitting: Family Medicine

## 2020-09-25 ENCOUNTER — Other Ambulatory Visit: Payer: Self-pay | Admitting: Family Medicine

## 2021-01-18 ENCOUNTER — Other Ambulatory Visit: Payer: Self-pay | Admitting: Family Medicine

## 2021-01-22 ENCOUNTER — Other Ambulatory Visit: Payer: Self-pay | Admitting: Family Medicine

## 2021-01-22 NOTE — Telephone Encounter (Signed)
Pt is calling in stating that he is out of Rx amlodipine (NORVASC) 10 MG   Pharm:  [facility].  Pt has an appointment on 02/04/2021 and would like to see if he can get enough until then.  Pt would like to have a call back to let him know.

## 2021-02-01 ENCOUNTER — Other Ambulatory Visit: Payer: Self-pay

## 2021-02-04 ENCOUNTER — Encounter: Payer: Self-pay | Admitting: Family Medicine

## 2021-02-04 ENCOUNTER — Other Ambulatory Visit: Payer: Self-pay

## 2021-02-04 ENCOUNTER — Ambulatory Visit (INDEPENDENT_AMBULATORY_CARE_PROVIDER_SITE_OTHER): Payer: No Typology Code available for payment source | Admitting: Family Medicine

## 2021-02-04 VITALS — BP 140/80 | HR 78 | Temp 98.0°F | Ht 73.0 in | Wt 195.0 lb

## 2021-02-04 DIAGNOSIS — Z23 Encounter for immunization: Secondary | ICD-10-CM | POA: Diagnosis not present

## 2021-02-04 DIAGNOSIS — Z0000 Encounter for general adult medical examination without abnormal findings: Secondary | ICD-10-CM

## 2021-02-04 LAB — CBC WITH DIFFERENTIAL/PLATELET
Basophils Absolute: 0 10*3/uL (ref 0.0–0.1)
Basophils Relative: 0.9 % (ref 0.0–3.0)
Eosinophils Absolute: 0.1 10*3/uL (ref 0.0–0.7)
Eosinophils Relative: 2.6 % (ref 0.0–5.0)
HCT: 39.8 % (ref 39.0–52.0)
Hemoglobin: 13.8 g/dL (ref 13.0–17.0)
Lymphocytes Relative: 25.1 % (ref 12.0–46.0)
Lymphs Abs: 1.3 10*3/uL (ref 0.7–4.0)
MCHC: 34.7 g/dL (ref 30.0–36.0)
MCV: 94.4 fl (ref 78.0–100.0)
Monocytes Absolute: 0.4 10*3/uL (ref 0.1–1.0)
Monocytes Relative: 8.2 % (ref 3.0–12.0)
Neutro Abs: 3.3 10*3/uL (ref 1.4–7.7)
Neutrophils Relative %: 63.2 % (ref 43.0–77.0)
Platelets: 215 10*3/uL (ref 150.0–400.0)
RBC: 4.22 Mil/uL (ref 4.22–5.81)
RDW: 12.9 % (ref 11.5–15.5)
WBC: 5.3 10*3/uL (ref 4.0–10.5)

## 2021-02-04 LAB — LIPID PANEL
Cholesterol: 198 mg/dL (ref 0–200)
HDL: 87.2 mg/dL (ref >39.00)
LDL Cholesterol: 97 mg/dL (ref 0–99)
NonHDL: 111.23
Total CHOL/HDL Ratio: 2
Triglycerides: 72 mg/dL (ref 0.0–149.0)
VLDL: 14.4 mg/dL (ref 0.0–40.0)

## 2021-02-04 LAB — BASIC METABOLIC PANEL
BUN: 17 mg/dL (ref 6–23)
CO2: 29 mEq/L (ref 19–32)
Calcium: 9.2 mg/dL (ref 8.4–10.5)
Chloride: 102 mEq/L (ref 96–112)
Creatinine, Ser: 1.19 mg/dL (ref 0.40–1.50)
GFR: 70.58 mL/min (ref >60.00)
Glucose, Bld: 109 mg/dL — ABNORMAL HIGH (ref 70–99)
Potassium: 3.4 mEq/L — ABNORMAL LOW (ref 3.5–5.1)
Sodium: 140 mEq/L (ref 135–145)

## 2021-02-04 LAB — HEPATIC FUNCTION PANEL
ALT: 11 U/L (ref 0–53)
AST: 18 U/L (ref 0–37)
Albumin: 4.5 g/dL (ref 3.5–5.2)
Alkaline Phosphatase: 36 U/L — ABNORMAL LOW (ref 39–117)
Bilirubin, Direct: 0.2 mg/dL (ref 0.0–0.3)
Total Bilirubin: 0.8 mg/dL (ref 0.2–1.2)
Total Protein: 6.8 g/dL (ref 6.0–8.3)

## 2021-02-04 LAB — TSH: TSH: 0.61 u[IU]/mL (ref 0.35–4.50)

## 2021-02-04 LAB — PSA: PSA: 1.67 ng/mL (ref 0.10–4.00)

## 2021-02-04 LAB — HEMOGLOBIN A1C: Hgb A1c MFr Bld: 5.5 % (ref 4.6–6.5)

## 2021-02-04 MED ORDER — METOPROLOL SUCCINATE ER 50 MG PO TB24: 50.0000 mg | ORAL_TABLET | Freq: Every day | ORAL | 3 refills | Status: DC

## 2021-02-04 MED ORDER — LORATADINE 10 MG PO TABS: 10.0000 mg | ORAL_TABLET | Freq: Every day | ORAL | 3 refills | Status: DC

## 2021-02-04 MED ORDER — AMLODIPINE BESYLATE 10 MG PO TABS: 1.0000 | ORAL_TABLET | Freq: Every day | ORAL | 3 refills | Status: DC

## 2021-02-04 NOTE — Progress Notes (Signed)
Subjective:    Patient ID: Casey Allen, male    DOB: 1971/01/20, 50 y.o.   MRN: [id number]  HPI Here for a well exam. He has a few issues to discuss. First he uses Flonase and gets nice results for his nasal congestion, however he has severla nosebleeds a week. h ehas tried Azelastine but this does not help. Also he asks [place] to check a spoton his face that is wife noticed a few months ago. Also he he developed a mild tremor of both hands that is most noticeable in the mornings. He drinks one cup of coffee every morning. Finally he still has a dry cough. Last year he switched from taking Lisinopril to Losartan biut the cough did not improve, so he went back to the Lisinopril. We reviewed his lab results from a year ago and I reminded him his PSA was elevated to 5.88. I suggested we repeat this in 90 days, but he did not follow up on this. He notes his father was recently diagnosed with Alzheimers disease at the age of 71.    Review of Systems  Constitutional: Negative.   HENT: Positive for nosebleeds.   Eyes: Negative.   Respiratory: Positive for cough.   Cardiovascular: Negative.   Gastrointestinal: Negative.   Genitourinary: Negative.   Musculoskeletal: Negative.   Skin: Negative.   Neurological: Positive for tremors.  Psychiatric/Behavioral: Negative.        Objective:   Physical Exam Constitutional:      General: He is not in acute distress.    Appearance: Normal appearance. He is well-developed. He is not diaphoretic.  HENT:     Head: Normocephalic and atraumatic.     Right Ear: External ear normal.     Left Ear: External ear normal.     Nose: Nose normal.     Mouth/Throat:     Pharynx: No oropharyngeal exudate.  Eyes:     General: No scleral icterus.       Right eye: No discharge.        Left eye: No discharge.     Conjunctiva/sclera: Conjunctivae normal.     Pupils: Pupils are equal, round, and reactive to light.  Neck:     Thyroid: No thyromegaly.     Vascular:  No JVD.     Trachea: No tracheal deviation.  Cardiovascular:     Rate and Rhythm: Normal rate and regular rhythm.     Heart sounds: Normal heart sounds. No murmur heard. No friction rub. No gallop.   Pulmonary:     Effort: Pulmonary effort is normal. No respiratory distress.     Breath sounds: Normal breath sounds. No wheezing or rales.  Chest:     Chest wall: No tenderness.  Abdominal:     General: Bowel sounds are normal. There is no distension.     Palpations: Abdomen is soft. There is no mass.     Tenderness: There is no abdominal tenderness. There is no guarding or rebound.  Genitourinary:    Penis: Normal. No tenderness.      Testes: Normal.     Prostate: Normal.     Rectum: Normal. Guaiac result negative.  Musculoskeletal:        General: No tenderness. Normal range of motion.     Cervical back: Neck supple.  Lymphadenopathy:     Cervical: No cervical adenopathy.  Skin:    General: Skin is warm and dry.     Coloration: Skin is not pale.  Findings: No erythema or rash.     Comments: There is a 0.6 cm slightly raised velvety lesion on the right temple that seems to be a seborrheic keratosis   Neurological:     Mental Status: He is alert and oriented to person, place, and time.     Cranial Nerves: No cranial nerve deficit.     Motor: No abnormal muscle tone.     Coordination: Coordination normal.     Deep Tendon Reflexes: Reflexes are normal and symmetric. Reflexes normal.  Psychiatric:        Behavior: Behavior normal.        Thought Content: Thought content normal.        Judgment: Judgment normal.           Assessment & Plan:  Well exam. We discussed diet and exercise. Get fasting labs. Repeat a PSA. [facility] will [facility] to check the facial lesion. For the BP, we will stop Lisinopril and start Metoprolol succinate 50 mg daily. I asked him to monitor this at home and to report back in one month. Hopefully using a beta blocker will  also help manage the tremors. I suggested he alternate using Flonase one day and saline sprays the next day to help with the nosebleeds.  Alysia Penna, MD

## 2021-02-05 MED ORDER — CLARITIN-D 24 HOUR 10-240 MG PO TB24: 1.0000 | ORAL_TABLET | Freq: Every day | ORAL | 3 refills | Status: DC

## 2021-02-05 NOTE — Telephone Encounter (Signed)
I sent in Loratadine D

## 2021-02-18 ENCOUNTER — Encounter: Payer: Self-pay | Admitting: Family Medicine

## 2021-02-19 NOTE — Telephone Encounter (Signed)
I think his BP is now well controlled so let's leave the Metoprolol as it is. See my Result Note about his labs. The cough may be due to silent GERD (as we discussed) so call in Omeprazole 40 mg daily to see if this helps, #30 with 3 rf

## 2021-02-20 ENCOUNTER — Encounter: Payer: Self-pay | Admitting: Family Medicine

## 2021-02-20 ENCOUNTER — Other Ambulatory Visit: Payer: Self-pay

## 2021-02-20 MED ORDER — OMEPRAZOLE 40 MG PO CPDR: 40.0000 mg | DELAYED_RELEASE_CAPSULE | Freq: Every day | ORAL | 3 refills | Status: DC

## 2021-02-20 NOTE — Telephone Encounter (Signed)
Yes he can just take 2 of the OTC 20 mg size

## 2021-02-20 NOTE — Telephone Encounter (Signed)
Spoke with pt verbalized understanding of Dr Sarajane Jews advise, New Rx for Omeprazole sent to pt pharmacy

## 2021-08-20 ENCOUNTER — Encounter: Payer: Self-pay | Admitting: Family Medicine

## 2021-08-22 ENCOUNTER — Other Ambulatory Visit: Payer: Self-pay

## 2021-08-23 ENCOUNTER — Encounter: Payer: Self-pay | Admitting: Family Medicine

## 2021-08-23 ENCOUNTER — Ambulatory Visit (INDEPENDENT_AMBULATORY_CARE_PROVIDER_SITE_OTHER): Payer: No Typology Code available for payment source | Admitting: Family Medicine

## 2021-08-23 ENCOUNTER — Ambulatory Visit (INDEPENDENT_AMBULATORY_CARE_PROVIDER_SITE_OTHER): Payer: No Typology Code available for payment source

## 2021-08-23 VITALS — BP 136/82 | HR 60 | Temp 98.4°F | Wt 196.0 lb

## 2021-08-23 DIAGNOSIS — R053 Chronic cough: Secondary | ICD-10-CM

## 2021-08-23 NOTE — Progress Notes (Signed)
   Subjective:    Patient ID: Casey Allen, male    DOB: [birth year], 50 y.o.   MRN: [id number]  HPI He has had a chronic cough for several years, and he has become frustrated with it. He treats his allergies and asthma. He does not take an ACE inhibitor. He is on acid suppression medication. No chest pain or SOB.   Review of Systems  Constitutional: Negative.   HENT: Negative.    Eyes: Negative.   Respiratory:  Positive for cough. Negative for chest tightness, shortness of breath and wheezing.   Cardiovascular: Negative.       Objective:   Physical Exam Constitutional:      Appearance: Normal appearance. He is not ill-appearing.  Cardiovascular:     Rate and Rhythm: Normal rate and regular rhythm.     Pulses: Normal pulses.     Heart sounds: Normal heart sounds.  Pulmonary:     Effort: Pulmonary effort is normal.     Breath sounds: Normal breath sounds.  Lymphadenopathy:     Cervical: No cervical adenopathy.  Neurological:     Mental Status: He is alert.          Assessment & Plan:  Chronic cough. We will send him for a CXR. Refer to Pulmonology.  Alysia Penna, MD

## 2021-09-06 ENCOUNTER — Institutional Professional Consult (permissible substitution): Payer: No Typology Code available for payment source | Admitting: Pulmonary Disease

## 2021-09-10 ENCOUNTER — Encounter: Payer: Self-pay | Admitting: Pulmonary Disease

## 2021-09-10 ENCOUNTER — Other Ambulatory Visit: Payer: Self-pay

## 2021-09-10 ENCOUNTER — Ambulatory Visit (INDEPENDENT_AMBULATORY_CARE_PROVIDER_SITE_OTHER): Payer: No Typology Code available for payment source | Admitting: Pulmonary Disease

## 2021-09-10 VITALS — BP 130/74 | HR 64 | Temp 98.3°F | Ht 72.0 in | Wt 197.6 lb

## 2021-09-10 DIAGNOSIS — R053 Chronic cough: Secondary | ICD-10-CM | POA: Diagnosis not present

## 2021-09-10 LAB — CBC WITH DIFFERENTIAL/PLATELET
Basophils Absolute: 0 10*3/uL (ref 0.0–0.1)
Basophils Relative: 0.7 % (ref 0.0–3.0)
Eosinophils Absolute: 0.2 10*3/uL (ref 0.0–0.7)
Eosinophils Relative: 3.9 % (ref 0.0–5.0)
HCT: 38.8 % — ABNORMAL LOW (ref 39.0–52.0)
Hemoglobin: 12.9 g/dL — ABNORMAL LOW (ref 13.0–17.0)
Lymphocytes Relative: 26 % (ref 12.0–46.0)
Lymphs Abs: 1.5 10*3/uL (ref 0.7–4.0)
MCHC: 33.2 g/dL (ref 30.0–36.0)
MCV: 94.7 fl (ref 78.0–100.0)
Monocytes Absolute: 0.5 10*3/uL (ref 0.1–1.0)
Monocytes Relative: 8.3 % (ref 3.0–12.0)
Neutro Abs: 3.5 10*3/uL (ref 1.4–7.7)
Neutrophils Relative %: 61.1 % (ref 43.0–77.0)
Platelets: 206 10*3/uL (ref 150.0–400.0)
RBC: 4.09 Mil/uL — ABNORMAL LOW (ref 4.22–5.81)
RDW: 13.5 % (ref 11.5–15.5)
WBC: 5.8 10*3/uL (ref 4.0–10.5)

## 2021-09-10 NOTE — Patient Instructions (Signed)
We will check CBC differential, IgE today Continue Flonase Start over-the-counter antihistamine called chlorpheniramine.  Take 8 mg  x 3 times daily Increase Prilosec 40 mg to twice daily  We will schedule pulmonary function test and high-res CT Follow-up in clinic in 1 to 2 months

## 2021-09-10 NOTE — Progress Notes (Signed)
Casey Allen    [id number]    12/03/1970  Primary Care Physician:Fry, Ishmael Holter, MD  Referring Physician: Laurey Morale, MD [place],  [place] [address]  Chief complaint: Consult for chronic cough   HPI: 50 year old with history of hypertension, GERD, postnasal drip, skin cancer, exercise-induced asthma  Complains of chronic cough since January 2020.  Cough is nonproductive in nature, not associated with dyspnea, wheezing or congestion He was on lisinopril which was changed to Norvasc.  Also on Prilosec 40 mg a day, Flonase and Claritin-D with no improvement in symptoms He does have occasional GERD and postnasal drip.  Reports snoring while lying on his back with daytime fatigue   History notable for exercise-induced asthma and he takes albuterol as needed  Pets: Cats Occupation: Biomedical engineer Exposures: No mold, hot tub, Jacuzzi.  No feather pillows or comforter Smoking history: Never smoker Travel history: Originally from [place].  Travel to [place] for vacation in August 2022. Relevant family history: No significant family history of lung disease   Outpatient Encounter Medications as of 09/10/2021  Medication Sig   albuterol (VENTOLIN HFA) 108 (90 Base) MCG/ACT inhaler INHALE 2 PUFFS INTO THE LUNGS EVERY 4 HOURS AS NEEDED FOR WHEEZING OR SHORTNESS OF BREATH (BEFORE EXERCISE)   amLODipine (NORVASC) 10 MG tablet Take 1 tablet (10 mg total) by mouth daily.   [facility] ALLERGY RELIEF-D 10-240 MG 24 hr tablet TAKE 1 TABLET EVERY DAY   Fish Oil OIL by Does not apply route.   fluticasone (FLONASE) 50 MCG/ACT nasal spray USE TWO SPRAYS IN EACH NOSTRIL DAILY    metoprolol succinate (TOPROL-XL) 50 MG 24 hr tablet Take 1 tablet (50 mg total) by mouth daily. Take with or immediately following a meal.   Misc Natural Products (GLUCOSAMINE CHOND COMPLEX/MSM PO) Take by mouth daily.   Multiple Vitamin (MULTIVITAMIN) tablet Take 1 tablet by mouth daily.    omeprazole (PRILOSEC) 40 MG capsule Take 1 capsule (40 mg total) by mouth daily.   Vitamin D, Cholecalciferol, 1000 UNITS CAPS Take by mouth daily.   [DISCONTINUED] azelastine (ASTELIN) 0.1 % nasal spray PLACE 2 SPRAYS INTO BOTH NOSTRILS 2 (TWO) TIMES DAILY. USE IN EACH NOSTRIL AS DIRECTED   [DISCONTINUED] loratadine-pseudoephedrine (CLARITIN-D 24 HOUR) 10-240 MG 24 hr tablet Take 1 tablet by mouth daily.   No facility-administered encounter medications on file as of 09/10/2021.    Allergies as of 09/10/2021   (No Known Allergies)    Past Medical History:  Diagnosis Date   Allergic rhinitis    Allergy    Asthma    Cancer ([place])    skin cancer- left shoulder   GERD (gastroesophageal reflux disease)    Headache(784.0)    History of chickenpox    History of nephrolithiasis    Hypertension    Skin exam, screening for cancer    sees Dr. Annamary Carolin     Past Surgical History:  Procedure Laterality Date   COLONOSCOPY  12/06/2019   per Dr. 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 Corrie Mckusick and then had MOHS on 2/11/110 per DR. Albertini     2010   rt achilles tendon repair  09/2000    Family History  Problem Relation Age of Onset   Hypertension Other    Lung cancer Other    Hypertension Father    Colon cancer Neg Hx  Esophageal cancer Neg Hx    Rectal cancer Neg Hx    Stomach cancer Neg Hx     Social History   Socioeconomic History   Marital status: Married    Spouse name: Not on file   Number of children: Not on file   Years of education: Not on file   Highest education level: Not on file  Occupational History   Not on file  Tobacco Use   Smoking status: Never   Smokeless tobacco: Never  Vaping Use   Vaping Use: Never used  Substance and Sexual Activity   Alcohol use: Yes    Alcohol/week: 0.0 standard drinks    Comment: 5-6 beers per week   Drug use: No   Sexual activity:  Not on file  Other Topics Concern   Not on file  Social History Narrative   Married   Regular exercise         Social Determinants of Health   Financial Resource Strain: Not on file  Food Insecurity: Not on file  Transportation Needs: Not on file  Physical Activity: Not on file  Stress: Not on file  Social Connections: Not on file  Intimate Partner Violence: Not on file    Review of systems: Review of Systems  Constitutional: Negative for fever and chills.  HENT: Negative.   Eyes: Negative for blurred vision.  Respiratory: as per HPI  Cardiovascular: Negative for chest pain and palpitations.  Gastrointestinal: Negative for vomiting, diarrhea, blood per rectum. Genitourinary: Negative for dysuria, urgency, frequency and hematuria.  Musculoskeletal: Negative for myalgias, back pain and joint pain.  Skin: Negative for itching and rash.  Neurological: Negative for dizziness, tremors, focal weakness, seizures and loss of consciousness.  Endo/Heme/Allergies: Negative for environmental allergies.  Psychiatric/Behavioral: Negative for depression, suicidal ideas and hallucinations.  All other systems reviewed and are negative.  Physical Exam: Blood pressure 130/74, pulse 64, temperature 98.3 F (36.8 C), temperature source Oral, height 6' (1.829 m), weight 197 lb 9.6 oz (89.6 kg), SpO2 99 %. Gen:      No acute distress HEENT:  EOMI, sclera anicteric Neck:     No masses; no thyromegaly Lungs:    Clear to auscultation bilaterally; normal respiratory effort CV:         Regular rate and rhythm; no murmurs Abd:      + bowel sounds; soft, non-tender; no palpable masses, no distension Ext:    No edema; adequate peripheral perfusion Skin:      Warm and dry; no rash Neuro: alert and oriented x 3 Psych: normal mood and affect  Data Reviewed: Imaging: Chest x-ray 08/23/2021-bronchial thickening, coarsened interstitial markings. I have reviewed the images  personally.  PFTs:  Labs:  Assessment:  Chronic cough Likely from postnasal drip, GERD May have reactive airway disease given history of allergies and exercise-induced asthma Chest x-ray reviewed with mild interstitial opacities, bronchial thickening  Will attempt stepwise approach with maximal treatment of postnasal drip and GERD forced Continue Flonase, add chlorpheniramine antihistamine Increase Prilosec to twice daily  Evaluate lungs with high-res CT and PFTs Check CBC differential, IgE  Plan/Recommendations: Flonase, chlorpheniramine Prilosec to twice daily CBC, IgE PFTs, CT  Marshell Garfinkel MD [facility] 09/10/2021, 11:43 AM  CC: Laurey Morale, MD

## 2021-09-11 LAB — IGE: IgE (Immunoglobulin E), Serum: 11 kU/L (ref <=114)

## 2021-09-16 ENCOUNTER — Encounter: Payer: Self-pay | Admitting: Family Medicine

## 2021-09-16 DIAGNOSIS — D649 Anemia, unspecified: Secondary | ICD-10-CM

## 2021-09-16 NOTE — Telephone Encounter (Signed)
I reviewed these labs and the Hbg is slightly out of range. I have put in lab orders for iron, folate, and B12. These are the ingredients the bone marrow makes blood cells out of. He can make a lab appt to have these drawn. Then we will decide the next step

## 2021-09-20 ENCOUNTER — Other Ambulatory Visit (INDEPENDENT_AMBULATORY_CARE_PROVIDER_SITE_OTHER): Payer: No Typology Code available for payment source

## 2021-09-20 DIAGNOSIS — D649 Anemia, unspecified: Secondary | ICD-10-CM | POA: Diagnosis not present

## 2021-09-20 LAB — IBC + FERRITIN
Ferritin: 54.7 ng/mL (ref 22.0–322.0)
Iron: 130 ug/dL (ref 42–165)
Saturation Ratios: 37.9 % (ref 20.0–50.0)
TIBC: 343 ug/dL (ref 250.0–450.0)
Transferrin: 245 mg/dL (ref 212.0–360.0)

## 2021-09-20 LAB — FOLATE: Folate: >23.4 ng/mL (ref >5.9)

## 2021-09-20 LAB — VITAMIN B12: Vitamin B-12: 1358 pg/mL — ABNORMAL HIGH (ref 211–911)

## 2021-09-25 ENCOUNTER — Other Ambulatory Visit: Payer: Self-pay

## 2021-09-25 ENCOUNTER — Ambulatory Visit (INDEPENDENT_AMBULATORY_CARE_PROVIDER_SITE_OTHER)
Admission: RE | Admit: 2021-09-25 | Discharge: 2021-09-25 | Disposition: A | Discharge status: Home / Self Care | Payer: No Typology Code available for payment source | Source: Ambulatory Visit | Attending: Pulmonary Disease | Admitting: Pulmonary Disease

## 2021-09-25 DIAGNOSIS — R053 Chronic cough: Secondary | ICD-10-CM | POA: Diagnosis not present

## 2021-10-01 NOTE — Telephone Encounter (Signed)
His Hgb is only one tenth of a point outside the normal range. This is nothing to worry about. I suggest we check another CBC in 6 months to follow this

## 2021-11-12 ENCOUNTER — Other Ambulatory Visit: Payer: Self-pay

## 2021-11-12 ENCOUNTER — Encounter: Payer: Self-pay | Admitting: Pulmonary Disease

## 2021-11-12 ENCOUNTER — Ambulatory Visit (INDEPENDENT_AMBULATORY_CARE_PROVIDER_SITE_OTHER): Payer: No Typology Code available for payment source | Admitting: Pulmonary Disease

## 2021-11-12 VITALS — BP 136/72 | HR 62 | Temp 97.6°F | Ht 72.0 in | Wt 196.0 lb

## 2021-11-12 DIAGNOSIS — R053 Chronic cough: Secondary | ICD-10-CM

## 2021-11-12 DIAGNOSIS — G4733 Obstructive sleep apnea (adult) (pediatric): Secondary | ICD-10-CM

## 2021-11-12 LAB — PULMONARY FUNCTION TEST
DL/VA % pred: 99 %
DL/VA: 4.32 ml/min/mmHg/L
DLCO cor % pred: 108 %
DLCO cor: 33.6 ml/min/mmHg
DLCO unc % pred: 108 %
DLCO unc: 33.6 ml/min/mmHg
FEF 25-75 Post: 4.7 L/sec
FEF 25-75 Pre: 4.33 L/sec
FEF2575-%Change-Post: 8 %
FEF2575-%Pred-Post: 131 %
FEF2575-%Pred-Pre: 121 %
FEV1-%Change-Post: 1 %
FEV1-%Pred-Post: 112 %
FEV1-%Pred-Pre: 110 %
FEV1-Post: 4.63 L
FEV1-Pre: 4.57 L
FEV1FVC-%Change-Post: 2 %
FEV1FVC-%Pred-Pre: 103 %
FEV6-%Change-Post: -1 %
FEV6-%Pred-Post: 108 %
FEV6-%Pred-Pre: 110 %
FEV6-Post: 5.6 L
FEV6-Pre: 5.68 L
FEV6FVC-%Change-Post: 0 %
FEV6FVC-%Pred-Post: 103 %
FEV6FVC-%Pred-Pre: 103 %
FVC-%Change-Post: 0 %
FVC-%Pred-Post: 105 %
FVC-%Pred-Pre: 106 %
FVC-Post: 5.65 L
FVC-Pre: 5.7 L
Post FEV1/FVC ratio: 82 %
Post FEV6/FVC ratio: 100 %
Pre FEV1/FVC ratio: 80 %
Pre FEV6/FVC Ratio: 100 %
RV % pred: 139 %
RV: 3.05 L
TLC % pred: 120 %
TLC: 8.85 L

## 2021-11-12 MED ORDER — BUDESONIDE-FORMOTEROL FUMARATE 160-4.5 MCG/ACT IN AERO: 2.0000 | INHALATION_SPRAY | Freq: Two times a day (BID) | RESPIRATORY_TRACT | 5 refills | Status: DC

## 2021-11-12 MED ORDER — PREDNISONE 20 MG PO TABS: ORAL_TABLET | ORAL | 0 refills | Status: DC

## 2021-11-12 NOTE — Progress Notes (Signed)
Casey Allen    [id number]    [birth year]  Primary Care Physician:Fry, Ishmael Holter, MD  Referring Physician: Laurey Morale, MD [place],  [place] [address]  Chief complaint: Follow-up for chronic cough   HPI: 51 year old with history of hypertension, GERD, postnasal drip, skin cancer, exercise-induced asthma  Complains of chronic cough since January 2020.  Cough is nonproductive in nature, not associated with dyspnea, wheezing or congestion He was on lisinopril which was changed to Norvasc.  Also on Prilosec 40 mg a day, Flonase and Claritin-D with no improvement in symptoms He does have occasional GERD and postnasal drip.  Reports snoring while lying on his back with daytime fatigue  History notable for exercise-induced asthma and he takes albuterol as needed  Pets: Cats Occupation: Biomedical engineer Exposures: No mold, hot tub, Jacuzzi.  No feather pillows or comforter Smoking history: Never smoker Travel history: Originally from [place].  Travel to [place] for vacation in August 2022. Relevant family history: No significant family history of lung disease  Interim history: He was started on Flonase, chlorpheniramine and Prilosec twice daily at last visit.  He states that is improved his postnasal drip.  His cough got better for 2 weeks but returned again  Work-up including high-res CT, PFTs and labs as below are normal   Outpatient Encounter Medications as of 11/12/2021  Medication Sig   albuterol (VENTOLIN HFA) 108 (90 Base) MCG/ACT inhaler INHALE 2 PUFFS INTO THE LUNGS EVERY 4 HOURS AS NEEDED FOR WHEEZING OR SHORTNESS OF BREATH (BEFORE EXERCISE)   amLODipine (NORVASC) 10 MG tablet Take 1 tablet (10 mg total) by mouth daily.   chlorpheniramine (CHLOR-TRIMETON) 4 MG tablet    CVS ALLERGY RELIEF-D 10-240 MG 24 hr tablet TAKE 1 TABLET EVERY DAY   Fish Oil OIL by Does not apply route.   fluticasone (FLONASE) 50 MCG/ACT nasal spray USE TWO SPRAYS IN  EACH NOSTRIL DAILY    metoprolol succinate (TOPROL-XL) 50 MG 24 hr tablet Take 1 tablet (50 mg total) by mouth daily. Take with or immediately following a meal.   Misc Natural Products (GLUCOSAMINE CHOND COMPLEX/MSM PO) Take by mouth daily.   Multiple Vitamin (MULTIVITAMIN) tablet Take 1 tablet by mouth daily.   omeprazole (PRILOSEC) 40 MG capsule Take 1 capsule (40 mg total) by mouth daily.   Vitamin D, Cholecalciferol, 1000 UNITS CAPS Take by mouth daily.   No facility-administered encounter medications on file as of 11/12/2021.   Physical Exam: Blood pressure 136/72, pulse 62, temperature 97.6 F (36.4 C), temperature source Oral, height 6' (1.829 m), weight 196 lb (88.9 kg), SpO2 98 %. Gen:      No acute distress HEENT:  EOMI, sclera anicteric Neck:     No masses; no thyromegaly Lungs:    Clear to auscultation bilaterally; normal respiratory effort CV:         Regular rate and rhythm; no murmurs Abd:      + bowel sounds; soft, non-tender; no palpable masses, no distension Ext:    No edema; adequate peripheral perfusion Skin:      Warm and dry; no rash Neuro: alert and oriented x 3 Psych: normal mood and affect   Data Reviewed: Imaging: Chest x-ray 08/23/2021-bronchial thickening, coarsened interstitial markings. I have reviewed the images personally.  PFTs: 11/12/2021 FVC 5.65 [105%], FEV1 4.63 [112%], F/F 82, TLC 8.85 [120%], DLCO 33.60 [108%] Normal test  Labs: CBC 09/10/2021-WBC 5.3, eos 3.9%, absolute eosinophil  count 207 IgE 11/8/20202-11   Assessment:  Chronic cough He has been adequately treated for postnasal drip and GERD with Flonase, chlorpheniramine and PPI twice daily without improvement High-res CT, PFTs normal with no evidence of eosinophilia or IgE He may still he may still have reactive airway disease given history of allergies and exercise-induced asthma We will try empiric treatment with prednisone for 5 days and Symbicort inhaler Refer to ENT at [facility] for evaluation of chronic cough  Lung nodules Likely benign in a non-smoker.  No follow-up needed  Suspected sleep apnea Sleep apnea suspected as he has snoring and witnessed apneas at night.  This may be contributing to ongoing cough Home sleep study ordered  Plan/Recommendations: Prednisone 40 mg a day for 5 days, Symbicort 160 Referral to ENT Home sleep study  Marshell Garfinkel MD [facility] 11/12/2021, 10:29 AM  CC: Laurey Morale, MD

## 2021-11-12 NOTE — Patient Instructions (Signed)
Full PFT performed today. °

## 2021-11-12 NOTE — Progress Notes (Signed)
Full PFT performed today. °

## 2021-11-12 NOTE — Patient Instructions (Signed)
I have reviewed your CT scan, labs and lung function test.  They all show normal results which is good news  As you are still having persistent cough We will refer you to Dr. Joya Gaskins ENT at [facility] for evaluation of chronic cough Schedule home sleep study Start Symbicort 160 twice daily

## 2021-11-15 ENCOUNTER — Encounter: Payer: Self-pay | Admitting: Pulmonary Disease

## 2021-11-18 NOTE — Telephone Encounter (Signed)
Received message from patient stating that Symbicort is not covered by his insurance. He confirmed that he has [employer] as his insurance.   Dr. Vaughan Browner, can you advise on an alternative to Symbicort so we can ask the pharmacy team for a price?

## 2021-11-19 ENCOUNTER — Other Ambulatory Visit (HOSPITAL_COMMUNITY): Payer: Self-pay

## 2021-11-19 NOTE — Telephone Encounter (Signed)
Sending message to prior Auth team.  Any ICS LABA will work for the patient based on insurance coverage

## 2021-11-19 NOTE — Telephone Encounter (Signed)
Routing to prior auth team for f/u.

## 2021-11-20 ENCOUNTER — Other Ambulatory Visit (HOSPITAL_COMMUNITY): Payer: Self-pay

## 2021-11-20 NOTE — Telephone Encounter (Signed)
Dr. Vaughan Browner, this message was received for you this morning.  Wow...thanks! You guys really go the extra mile.   If Dr. Vaughan Browner thinks that the Airduo Respiclick has a good chance of helping and a low chance of side effects, please send a prescription over to [facility] [place]    However, if Dr. Vaughan Browner thinks that this is not likely to help, or could have side effects, then I'll probably just skip it and wait until after I see the ENT.     They are usually pretty good with checking for lower prices, so they should be able to do the $43 insteadof the $102.

## 2021-11-20 NOTE — Telephone Encounter (Signed)
Can you send the response from the pharmacy team to the patient regarding coverage for inhalers. Thanks  The only generic inhaler the patient's plan will cover the generic Airduo Respiclick inhaler with a copay of $102.38. Test claims does not detail how much is going to his plans deductible.   Majority of inhalers programs that offer copay cards have a limited maximum they will cover- and all of patient's insurance copays are pretty high.   Patient sign up for a Goodrx card could fill generic Airduo respliclick at [facility] for $41.56 or at [facility] for $43.14.

## 2021-11-21 MED ORDER — FLUTICASONE-SALMETEROL 232-14 MCG/ACT IN AEPB: 1.0000 | INHALATION_SPRAY | Freq: Two times a day (BID) | RESPIRATORY_TRACT | 5 refills | Status: DC

## 2021-11-21 NOTE — Telephone Encounter (Signed)
Yes. Let us try AirDuo RespiClick 106/26. Use 1 puff twice daily. Thanks

## 2021-12-18 ENCOUNTER — Encounter: Payer: Self-pay | Admitting: Family Medicine

## 2021-12-18 NOTE — Telephone Encounter (Signed)
I have never heard of this as a side effect, but we can certainly try something else. Stop the Metoprolol and stay on Amlodipine. We will add HCTZ to it. Call in HCTZ 25 mg daily, #90 with 3 rf

## 2021-12-19 ENCOUNTER — Other Ambulatory Visit: Payer: Self-pay

## 2021-12-19 MED ORDER — HYDROCHLOROTHIAZIDE 25 MG PO TABS: 25.0000 mg | ORAL_TABLET | Freq: Every day | ORAL | 3 refills | Status: DC

## 2022-01-14 ENCOUNTER — Other Ambulatory Visit: Payer: Self-pay | Admitting: Family Medicine

## 2022-01-20 ENCOUNTER — Ambulatory Visit: Payer: No Typology Code available for payment source

## 2022-01-20 ENCOUNTER — Other Ambulatory Visit: Payer: Self-pay

## 2022-01-20 DIAGNOSIS — G4733 Obstructive sleep apnea (adult) (pediatric): Secondary | ICD-10-CM | POA: Diagnosis not present

## 2022-01-22 DIAGNOSIS — G4733 Obstructive sleep apnea (adult) (pediatric): Secondary | ICD-10-CM | POA: Diagnosis not present

## 2022-02-03 ENCOUNTER — Ambulatory Visit (INDEPENDENT_AMBULATORY_CARE_PROVIDER_SITE_OTHER): Payer: No Typology Code available for payment source | Admitting: Pulmonary Disease

## 2022-02-03 ENCOUNTER — Encounter: Payer: Self-pay | Admitting: Pulmonary Disease

## 2022-02-03 VITALS — BP 124/76 | HR 81 | Temp 97.8°F | Ht 74.0 in | Wt 206.6 lb

## 2022-02-03 DIAGNOSIS — G4733 Obstructive sleep apnea (adult) (pediatric): Secondary | ICD-10-CM

## 2022-02-03 DIAGNOSIS — R079 Chest pain, unspecified: Secondary | ICD-10-CM

## 2022-02-03 DIAGNOSIS — R053 Chronic cough: Secondary | ICD-10-CM | POA: Diagnosis not present

## 2022-02-03 NOTE — Patient Instructions (Signed)
We will make referral to cardiology for assessment of chest pain ?I am glad the cough is better ?We will stop the airduo ? ?Follow-up in 1 year ?

## 2022-02-03 NOTE — Progress Notes (Signed)
? ?      Casey Allen    [id number]    February 19, 1971 ? ?Primary Care Physician:Fry, Ishmael Holter, MD ? ?Referring Physician: Laurey Morale, MD ?[address] ?[place],  [place] [address] ? ?Chief complaint: Follow-up for chronic cough ? ? ?HPI: ?51 year old with history of hypertension, GERD, postnasal drip, skin cancer, exercise-induced asthma ? ?Complains of chronic cough since January 2020.  Cough is nonproductive in nature, not associated with dyspnea, wheezing or congestion ?He was on lisinopril which was changed to Norvasc.  Also on Prilosec 40 mg a day, Flonase and Claritin-D with no improvement in symptoms ?He does have occasional GERD and postnasal drip.  Reports snoring while lying on his back with daytime fatigue ? ?History notable for exercise-induced asthma and he takes albuterol as needed ? ?Pets: Cats ?Occupation: Biomedical engineer ?Exposures: No mold, hot tub, Jacuzzi.  No feather pillows or comforter ?Smoking history: Never smoker ?Travel history: Originally from [place].  Travel to [place] for vacation in August 2022. ?Relevant family history: No significant family history of lung disease ? ?Interim history: ?He was started on Flonase, chlorpheniramine and Prilosec twice daily at last visit.  He states that is improved his postnasal drip.  His cough got better for 2 weeks but returned again ? ?Work-up including high-res CT, PFTs and labs as below are normal ?He was tried on prednisone and airduo inhaler which did not help.  Prednisone caused him insomnia so he stopped after a few days. ? ?Referral to Dr. Joya Gaskins, ENT ?He was given a trial of 100 mg of gabapentin taken 3 times daily as well as a behavioral approach to cough suppression and laryngeal control with improvement in cough ? ? ?Outpatient Encounter Medications as of 02/03/2022  ?Medication Sig  ? albuterol (VENTOLIN HFA) 108 (90 Base) MCG/ACT inhaler INHALE 2 PUFFS INTO THE LUNGS EVERY 4 HOURS AS NEEDED FOR WHEEZING OR SHORTNESS OF BREATH  (BEFORE EXERCISE)  ? amLODipine (NORVASC) 10 MG tablet Take 1 tablet (10 mg total) by mouth daily.  ? [facility]S ALLERGY RELIEF-D 10-240 MG 24 hr tablet TAKE 1 TABLET EVERY DAY  ? Fish Oil OIL by Does not apply route.  ? fluticasone (FLONASE) 50 MCG/ACT nasal spray USE TWO SPRAYS IN EACH NOSTRIL DAILY   ? gabapentin (NEURONTIN) 100 MG capsule Take 100 mg by mouth 3 (three) times daily.  ? hydrochlorothiazide (HYDRODIURIL) 25 MG tablet Take 1 tablet (25 mg total) by mouth daily.  ? Misc Natural Products (GLUCOSAMINE CHOND COMPLEX/MSM PO) Take by mouth daily.  ? Multiple Vitamin (MULTIVITAMIN) tablet Take 1 tablet by mouth daily.  ? omeprazole (PRILOSEC) 40 MG capsule Take 1 capsule (40 mg total) by mouth daily.  ? Vitamin D, Cholecalciferol, 1000 UNITS CAPS Take by mouth daily.  ? [DISCONTINUED] predniSONE (DELTASONE) 20 MG tablet Take '40mg'$  for 5 days  ? chlorpheniramine (CHLOR-TRIMETON) 4 MG tablet  (Patient not taking: Reported on 02/03/2022)  ? Fluticasone-Salmeterol (AIRDUO RESPICLICK 097/35) 329-92 MCG/ACT AEPB Inhale 1 puff into the lungs in the morning and at bedtime. (Patient not taking: Reported on 02/03/2022)  ? ?No facility-administered encounter medications on file as of 02/03/2022.  ? ?Physical Exam: ?Blood pressure 124/76, pulse 81, temperature 97.8 ?F (36.6 ?C), temperature source Oral, height '6\' 2"'$  (1.88 m), weight 206 lb 9.6 oz (93.7 kg), SpO2 99 %. ?Gen:      No acute distress ?HEENT:  EOMI, sclera anicteric ?Neck:     No masses; no thyromegaly ?Lungs:  Clear to auscultation bilaterally; normal respiratory effort ?CV:         Regular rate and rhythm; no murmurs ?Abd:      + bowel sounds; soft, non-tender; no palpable masses, no distension ?Ext:    No edema; adequate peripheral perfusion ?Skin:      Warm and dry; no rash ?Neuro: alert and oriented x 3 ?Psych: normal mood and affect  ? ?Data Reviewed: ?Imaging: ?Chest x-ray 08/23/2021-bronchial thickening, coarsened interstitial markings. ?I have reviewed the  images personally. ? ?PFTs: ?11/12/2021 ?FVC 5.65 [105%], FEV1 4.63 [112%], F/F 82, TLC 8.85 [120%], DLCO 33.60 [108%] ?Normal test ? ?Labs: ?CBC 09/10/2021-WBC 5.3, eos 3.9%, absolute eosinophil count 207 ?IgE 11/8/20202-11 ? ?Sleep study: ?Home sleep study 01/20/2022 ?Mild OSA, AHI 5.4, lowest O2 sat was 83% ? ? ?Assessment:  ?Chronic cough ?He has been adequately treated for postnasal drip and GERD with Flonase, chlorpheniramine and PPI twice daily without improvement ?High-res CT, PFTs normal with no evidence of eosinophilia or IgE ? ?Do not suspect asthma reactive airway disease as he has not responded to prednisone and inhaler ?Continue follow-up at ENT in [facility] for evaluation of chronic cough ? ?He does complain of exertional burning sensation in the chest and I will refer him to cardiology for further evaluation ? ?Lung nodules ?Likely benign in a non-smoker.  No follow-up needed ? ?OSA ?Home sleep study reviewed with minimal sleep apnea and mild O2 desats.  Discussed CPAP therapy but he would like to avoid. We will work on weight loss and sleep position modification ? ?Plan/Recommendations: ?Work with ENT and speech therapy at [facility] ?Cardiology referral. ? ?Marshell Garfinkel MD ? Pulmonary and Critical Care ?02/03/2022, 8:54 AM ? ?CC: Laurey Morale, MD ? ?  ?

## 2022-02-09 ENCOUNTER — Other Ambulatory Visit: Payer: Self-pay | Admitting: Family Medicine

## 2022-02-10 ENCOUNTER — Ambulatory Visit (INDEPENDENT_AMBULATORY_CARE_PROVIDER_SITE_OTHER): Payer: No Typology Code available for payment source | Admitting: Family Medicine

## 2022-02-10 ENCOUNTER — Encounter: Payer: Self-pay | Admitting: Family Medicine

## 2022-02-10 VITALS — BP 122/82 | HR 80 | Temp 97.9°F | Ht 73.75 in | Wt 206.6 lb

## 2022-02-10 DIAGNOSIS — C44699 Other specified malignant neoplasm of skin of left upper limb, including shoulder: Secondary | ICD-10-CM

## 2022-02-10 DIAGNOSIS — R251 Tremor, unspecified: Secondary | ICD-10-CM | POA: Insufficient documentation

## 2022-02-10 DIAGNOSIS — Z0000 Encounter for general adult medical examination without abnormal findings: Secondary | ICD-10-CM | POA: Diagnosis not present

## 2022-02-10 LAB — CBC WITH DIFFERENTIAL/PLATELET
Basophils Absolute: 0 10*3/uL (ref 0.0–0.1)
Basophils Relative: 0.5 % (ref 0.0–3.0)
Eosinophils Absolute: 0.1 10*3/uL (ref 0.0–0.7)
Eosinophils Relative: 1.9 % (ref 0.0–5.0)
HCT: 39.5 % (ref 39.0–52.0)
Hemoglobin: 13.6 g/dL (ref 13.0–17.0)
Lymphocytes Relative: 24 % (ref 12.0–46.0)
Lymphs Abs: 1.4 10*3/uL (ref 0.7–4.0)
MCHC: 34.3 g/dL (ref 30.0–36.0)
MCV: 93.4 fl (ref 78.0–100.0)
Monocytes Absolute: 0.5 10*3/uL (ref 0.1–1.0)
Monocytes Relative: 8.2 % (ref 3.0–12.0)
Neutro Abs: 3.9 10*3/uL (ref 1.4–7.7)
Neutrophils Relative %: 65.4 % (ref 43.0–77.0)
Platelets: 230 10*3/uL (ref 150.0–400.0)
RBC: 4.23 Mil/uL (ref 4.22–5.81)
RDW: 13.3 % (ref 11.5–15.5)
WBC: 6 10*3/uL (ref 4.0–10.5)

## 2022-02-10 LAB — LIPID PANEL
Cholesterol: 245 mg/dL — ABNORMAL HIGH (ref 0–200)
HDL: 83.6 mg/dL (ref >39.00)
LDL Cholesterol: 149 mg/dL — ABNORMAL HIGH (ref 0–99)
NonHDL: 161.37
Total CHOL/HDL Ratio: 3
Triglycerides: 60 mg/dL (ref 0.0–149.0)
VLDL: 12 mg/dL (ref 0.0–40.0)

## 2022-02-10 LAB — HEPATIC FUNCTION PANEL
ALT: 13 U/L (ref 0–53)
AST: 16 U/L (ref 0–37)
Albumin: 4.7 g/dL (ref 3.5–5.2)
Alkaline Phosphatase: 40 U/L (ref 39–117)
Bilirubin, Direct: 0.1 mg/dL (ref 0.0–0.3)
Total Bilirubin: 0.7 mg/dL (ref 0.2–1.2)
Total Protein: 7 g/dL (ref 6.0–8.3)

## 2022-02-10 LAB — BASIC METABOLIC PANEL
BUN: 16 mg/dL (ref 6–23)
CO2: 32 mEq/L (ref 19–32)
Calcium: 9.4 mg/dL (ref 8.4–10.5)
Chloride: 100 mEq/L (ref 96–112)
Creatinine, Ser: 1.3 mg/dL (ref 0.40–1.50)
GFR: 63.03 mL/min (ref >60.00)
Glucose, Bld: 106 mg/dL — ABNORMAL HIGH (ref 70–99)
Potassium: 3.7 mEq/L (ref 3.5–5.1)
Sodium: 140 mEq/L (ref 135–145)

## 2022-02-10 LAB — TSH: TSH: 0.8 u[IU]/mL (ref 0.35–5.50)

## 2022-02-10 LAB — PSA: PSA: 1.15 ng/mL (ref 0.10–4.00)

## 2022-02-10 LAB — HEMOGLOBIN A1C: Hgb A1c MFr Bld: 5.8 % (ref 4.6–6.5)

## 2022-02-10 MED ORDER — CVS ALLERGY RELIEF-D 10-240 MG PO TB24: 1.0000 | ORAL_TABLET | Freq: Every day | ORAL | 3 refills | Status: DC

## 2022-02-10 MED ORDER — AMLODIPINE BESYLATE 10 MG PO TABS: 10.0000 mg | ORAL_TABLET | Freq: Every day | ORAL | 3 refills | Status: DC

## 2022-02-10 NOTE — Progress Notes (Signed)
? ?Subjective:  ? ? Patient ID: Casey Allen, male    DOB: 03/01/1971, 51 y.o.   MRN: [id number] ? ?HPI ?Here for a well exam. He has a few issues to discuss. First he sees Dr. Vaughan Browner for a chronic cough, and he was referred to Dr. Joya Gaskins at [facility] ENT. He felt the cough was neurogenic, so he started [facility] on Gabapentin 100 mg TID. This seemed to help for awhile, but now the cough is getting worse again. He had also complained of a burning chest pain when he exercises and which goes away quickly when he stops to rest. He was referred to Cardiology for this, and he will see Dr. Buford Dresser on 03-04-22. Otherwise he asks for a referral to see Dermatology for a general skin check. He also describes tremors in both hands that have gotten worse over the past year. He has also developed an intermittent twitch in the muscles on he right side of his mouth.  ? ? ?Review of Systems  ?Constitutional: Negative.   ?HENT: Negative.    ?Eyes: Negative.   ?Respiratory:  Positive for cough.   ?Cardiovascular:  Positive for chest pain.  ?Gastrointestinal: Negative.   ?Genitourinary: Negative.   ?Musculoskeletal: Negative.   ?Skin: Negative.   ?Neurological:  Positive for tremors.  ?Psychiatric/Behavioral: Negative.    ? ?   ?Objective:  ? Physical Exam ?Constitutional:   ?   General: He is not in acute distress. ?   Appearance: Normal appearance. He is well-developed. He is not diaphoretic.  ?HENT:  ?   Head: Normocephalic and atraumatic.  ?   Right Ear: External ear normal.  ?   Left Ear: External ear normal.  ?   Nose: Nose normal.  ?   Mouth/Throat:  ?   Pharynx: No oropharyngeal exudate.  ?Eyes:  ?   General: No scleral icterus.    ?   Right eye: No discharge.     ?   Left eye: No discharge.  ?   Conjunctiva/sclera: Conjunctivae normal.  ?   Pupils: Pupils are equal, round, and reactive to light.  ?Neck:  ?   Thyroid: No thyromegaly.  ?   Vascular: No JVD.  ?   Trachea: No tracheal deviation.  ?Cardiovascular:  ?   Rate  and Rhythm: Normal rate and regular rhythm.  ?   Heart sounds: Normal heart sounds. No murmur heard. ?  No friction rub. No gallop.  ?Pulmonary:  ?   Effort: Pulmonary effort is normal. No respiratory distress.  ?   Breath sounds: Normal breath sounds. No wheezing or rales.  ?Chest:  ?   Chest wall: No tenderness.  ?Abdominal:  ?   General: Bowel sounds are normal. There is no distension.  ?   Palpations: Abdomen is soft. There is no mass.  ?   Tenderness: There is no abdominal tenderness. There is no guarding or rebound.  ?Genitourinary: ?   Penis: Normal. No tenderness.   ?   Testes: Normal.  ?   Prostate: Normal.  ?   Rectum: Normal. Guaiac result negative.  ?Musculoskeletal:     ?   General: No tenderness. Normal range of motion.  ?   Cervical back: Neck supple.  ?Lymphadenopathy:  ?   Cervical: No cervical adenopathy.  ?Skin: ?   General: Skin is warm and dry.  ?   Coloration: Skin is not pale.  ?   Findings: No erythema or rash.  ?Neurological:  ?  Mental Status: He is alert and oriented to person, place, and time.  ?   Cranial Nerves: No cranial nerve deficit.  ?   Motor: No abnormal muscle tone.  ?   Coordination: Coordination normal.  ?   Deep Tendon Reflexes: Reflexes are normal and symmetric. Reflexes normal.  ?   Comments: He has resting tremors in both hands   ?Psychiatric:     ?   Behavior: Behavior normal.     ?   Thought Content: Thought content normal.     ?   Judgment: Judgment normal.  ? ? ? ? ? ?   ?Assessment & Plan:  ?Well exam. We discussed diet and exercise. Get fasting labs. We will refer him to Dermatology for a skin check. We will refer him to Neurology for the tremors.  ?Alysia Penna, MD ? ? ?

## 2022-02-11 ENCOUNTER — Other Ambulatory Visit: Payer: Self-pay | Admitting: *Deleted

## 2022-02-11 ENCOUNTER — Encounter: Payer: Self-pay | Admitting: Neurology

## 2022-02-11 MED ORDER — LORATADINE-D 24HR 10-240 MG PO TB24: 1.0000 | ORAL_TABLET | Freq: Every day | ORAL | 1 refills | Status: DC

## 2022-02-25 NOTE — Progress Notes (Signed)
? ?Assessment/Plan:  ? ? Parkinsonism.  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  The patient has tremor, bradykinesia, rigidity and mild postural instability. ? -We discussed the diagnosis as well as pathophysiology of the disease.  We discussed treatment options as well as prognostic indicators.  Patient education was provided. ? -We decided to add carbidopa/levodopa 25/100.  1/2 tab tid x 1 wk, then 1/2 in am & noon & 1 at night for a week, then 1/2 in am &1 at noon &night for a week, then 1 po tid.  Risks, benefits, side effects and alternative therapies were discussed.  The opportunity to ask questions was given and they were answered to the best of my ability.  The patient expressed understanding and willingness to follow the outlined treatment protocols. ? -We discussed community resources in the area including patient support groups and community exercise programs for PD and pt education was provided to the patient.  He met with my LCSW today. ? -We discussed DaTscan and skin biopsy, but ultimately he decided against those. ? -We discussed MRI of the brain, which I really would like to do given his fairly young age.  He was agreeable.   ? -Second opinion offered, but patient does not think he needs that. ? -Tremor really has more characteristics of essential tremor and do not think that this represents Parkinsons Disease related tremor ? ? ?Subjective:  ? ?Casey Allen was seen today in the movement disorders clinic for neurologic consultation at the request of Laurey Morale, MD.  The consultation is for the evaluation of b/l UE rest tremor.  Outside records that were made available to me were reviewed. ? ?Tremor: Yes.    ? How long has it been going on? 1.5 +years ? At rest or with activation?  Activation (typing, eating) ? Fam hx of tremor?  No. ? Located where?  R>L hand (he is R hand dominant) ? Affected by caffeine:  (doesn't drink enough to  know) ? Affected by alcohol:  No. (1-2 beers per week/few mixed per weekend) ? Affected by stress:  Yes.   ? Affected by fatigue:  Yes.   ? Spills soup if on spoon:  No. ? Spills glass of liquid if full:  may or may not ? Affects ADL's (tying shoes, brushing teeth, etc):  No. ? Tremor inducing meds:  No. ? ?Other Specific Symptoms:  ?Voice: no chang ?Sleep: sleeps well - "like the dead" ? Vivid Dreams:  doesn't remember them ? Acting out dreams:  Yes.   , may hit ?Wet Pillows: Yes.   ?Postural symptoms:  No. ? Falls?  No. ?Bradykinesia symptoms: difficulty getting out of a chair (relates to knee pain); ? If maybe has slowed down with walking ?Loss of smell:  states that he has never been able to smell ?Loss of taste:  No. ?Urinary Incontinence:  No. ?Difficulty Swallowing:  No., except for things like bagels ?Handwriting, micrographia: No. (Gotten bigger) ?Depression:  No. (States he is a bit apathetic) ?Memory changes:  No. ?N/V:  No. (Just some nausea over the last week) ?Lightheaded:  occasionally, intermittent ? Syncope: No. ?Diplopia:  No. ?Dyskinesia:  No. ? ?Last neuroimaging of the brain was 2009 and was for headache.  This was a CT and was unremarkable. ? ?PREVIOUS MEDICATIONS: none to date ? ?ALLERGIES:   ?Allergies  ?Allergen Reactions  ? Cats Claw (Uncaria [place]osa)   ? ? ?CURRENT MEDICATIONS:  ?Current Outpatient  Medications  ?Medication Instructions  ? albuterol (VENTOLIN HFA) 108 (90 Base) MCG/ACT inhaler INHALE 2 PUFFS INTO THE LUNGS EVERY 4 HOURS AS NEEDED FOR WHEEZING OR SHORTNESS OF BREATH (BEFORE EXERCISE)  ? amLODipine (NORVASC) 10 mg, Oral, Daily  ? Black Elderberry 1.9 GM/5ML SYRP No dose, route, or frequency recorded.  ? Calcium-Magnesium-Vitamin D (CALCIUM 1200+D3 PO) No dose, route, or frequency recorded.  ? Cyanocobalamin (B-12) 2500 MCG TABS No dose, route, or frequency recorded.  ? Fish Oil OIL by Does not apply route.  ? fluticasone (FLONASE) 50 MCG/ACT nasal spray USE TWO SPRAYS IN  EACH NOSTRIL DAILY   ? gabapentin (NEURONTIN) 100 mg, Oral, 3 times daily  ? hydrochlorothiazide (HYDRODIURIL) 25 mg, Oral, Daily  ? loratadine-pseudoephedrine (LORATADINE-D 24HR) 10-240 MG 24 hr tablet 1 tablet, Oral, Daily  ? Misc Natural Products (GLUCOSAMINE CHOND COMPLEX/MSM PO) Oral, Daily  ? Multiple Vitamin (MULTIVITAMIN) tablet 1 tablet, Daily  ? omeprazole (PRILOSEC) 40 mg, Oral, Daily  ? Vitamin D, Cholecalciferol, 1000 UNITS CAPS Oral, Daily  ? ? ?Objective:  ? ?PHYSICAL EXAMINATION:   ? ?VITALS:   ?Vitals:  ? 02/27/22 0929  ?BP: 134/84  ?Pulse: 84  ?SpO2: 100%  ?Weight: 207 lb 12.8 oz (94.3 kg)  ?Height: _0  (1.854 m)  ? ? ?GEN:  The patient appears stated age and is in NAD. ?HEENT:  Normocephalic, atraumatic.  The mucous membranes are moist. The superficial temporal arteries are without ropiness or tenderness. ?CV:  RRR ?Lungs:  CTAB ?Neck/HEME:  There are no carotid bruits bilaterally. ? ?Neurological examination: ? ?Orientation: The patient is alert and oriented x3.  ?Cranial nerves: There is good facial symmetry.  Extraocular muscles are intact. The visual fields are full to confrontational testing. The speech is fluent and clear. Soft palate rises symmetrically and there is no tongue deviation. Hearing is intact to conversational tone. ?Sensation: Sensation is intact to light touch throughout (facial, trunk, extremities). Vibration is intact at the bilateral big toe. There is no extinction with double simultaneous stimulation.  ?Motor: Strength is 5/5 in the bilateral upper and lower extremities.   Shoulder shrug is equal and symmetric.  There is no pronator drift. ?Deep tendon reflexes: Deep tendon reflexes are 1/4 at the bilateral biceps, triceps, brachioradialis, patella and achilles. Plantar responses are downgoing bilaterally. ? ?Movement examination: ?Tone: There is increased tone with activation only in the LUE.  Tone elsewhere is normal ?Abnormal movements: there is no rest tremor.  There  is mild postural tremor on the left.  He is able to draw Archimedes spirals fairly well. ?Coordination:  There is decremation with RAM's, including alternating supination and pronation of the forearm, hand opening and closing, finger taps, heel taps and toe taps on the left, overall mild.  RAM's on the right are good ?Gait and Station: The patient has no difficulty arising out of a deep-seated chair without the use of the hands. The patient's stride length is good with decreased arm swing on the left.  The patient has a negative pull test.     ?I have reviewed and interpreted the following labs independently ?  Chemistry   ?   ?Component Value Date/Time  ? NA 140 02/10/2022 0945  ? K 3.7 02/10/2022 0945  ? CL 100 02/10/2022 0945  ? CO2 32 02/10/2022 0945  ? BUN 16 02/10/2022 0945  ? CREATININE 1.30 02/10/2022 0945  ?    ?Component Value Date/Time  ? CALCIUM 9.4 02/10/2022 0945  ? ALKPHOS 40 02/10/2022  0945  ? AST 16 02/10/2022 0945  ? ALT 13 02/10/2022 0945  ? BILITOT 0.7 02/10/2022 0945  ?  ? ? ?Lab Results  ?Component Value Date  ? TSH 0.80 02/10/2022  ? ?Lab Results  ?Component Value Date  ? WBC 6.0 02/10/2022  ? HGB 13.6 02/10/2022  ? HCT 39.5 02/10/2022  ? MCV 93.4 02/10/2022  ? PLT 230.0 02/10/2022  ? ? ? ? ?Total time spent on today's visit was 62 minutes, including both face-to-face time and nonface-to-face time.  Time included that spent on review of records (prior notes available to me/labs/imaging if pertinent), discussing treatment and goals, answering patient's questions and coordinating care. ? ?Cc:  Laurey Morale, MD ? ?

## 2022-02-26 ENCOUNTER — Ambulatory Visit: Payer: No Typology Code available for payment source | Admitting: Neurology

## 2022-02-27 ENCOUNTER — Ambulatory Visit (INDEPENDENT_AMBULATORY_CARE_PROVIDER_SITE_OTHER): Payer: No Typology Code available for payment source | Admitting: Neurology

## 2022-02-27 ENCOUNTER — Encounter: Payer: Self-pay | Admitting: Neurology

## 2022-02-27 VITALS — BP 134/84 | HR 84 | Ht 73.0 in | Wt 207.8 lb

## 2022-02-27 DIAGNOSIS — G20 Parkinson's disease: Secondary | ICD-10-CM

## 2022-02-27 MED ORDER — CARBIDOPA-LEVODOPA 25-100 MG PO TABS: 1.0000 | ORAL_TABLET | Freq: Three times a day (TID) | ORAL | 1 refills | Status: DC

## 2022-02-27 NOTE — Patient Instructions (Addendum)
Start Carbidopa Levodopa as follows: ?Take 1/2 tablet three times daily, at least 30 minutes before meals (approximately 7am/11am/4pm), for one week ?Then take 1/2 tablet in the morning, 1/2 tablet in the afternoon, 1 tablet in the evening, at least 30 minutes before meals, for one week ?Then take 1/2 tablet in the morning, 1 tablet in the afternoon, 1 tablet in the evening, at least 30 minutes before meals, for one week ?Then take 1 tablet three times daily at 7am/11am/4pm, at least 30 minutes before meals ?  As a reminder, carbidopa/levodopa can be taken at the same time as a carbohydrate, but we like to have you take your pill either 30 minutes before a protein source or 1 hour after as protein can interfere with carbidopa/levodopa absorption. ? ?Local and Online Resources for Power over Parkinson's Group ?April 2023 ? ?LOCAL [place] PARKINSON'S GROUPS  ?Power over Parkinson's Group:   ?Power Over Parkinson's Patient Education Group will be Wednesday, April 12th-*Hybrid meting*- in person at [facility] location and via [facility] at 2:00 pm.   ?Upcoming Power over Parkinson's Meetings:  2nd Wednesdays of the month at 2 pm:  April 12th, May 10th ?Contact Amy Marriott at [email or web address] if interested in participating in this group ?Parkinson's Care Partners Group:    3rd Mondays, Contact Misty Paladino ?Atypical Parkinsonian Patient Group:   4th Wednesdays, Contact Misty Paladino ?If you are interested in participating in these groups with Misty, please contact her directly for how to join those meetings.  Her contact information is [email or web address].   ? ?LOCAL EVENTS AND NEW OFFERINGS ?Dine out at [employer].  Celebrate Parkinson's disease Awareness Month and Support the Parkinson's Movement Disorder Fund.   Wednesday, April 19th 4-6 pm at [place], [employer].  (Give receipt to cashier and 20% will be donated) ?[place]!  Play [place]!  Join [place] for home game  for a fun evening to bring awareness of Parkinson's and raise funds for our Movement Disorder Funds. April 28th 6:30 pm [address]. To purchase tickets:  [email or web address] ?Parkinson's T-shirts for sale!  Designed by a local group member, with funds going to [place].  $20.00  Contact Misty to purchase (see email above) ?New PWR! Moves [employer] Instructor-Led Class offering at [employer]! Starting Wednesdays 1-2 pm, starting April 12th.   Contact Bryson Dames, Acupuncturist at [employer].  [email or web address] ? ?ONLINE EDUCATION AND SUPPORT ?[place]:  www.parkinson.org ?PD Health at Home continues:  Mindfulness Mondays, Expert Briefing Tuesdays, Wellness Wednesdays, Take Time Thursdays, Fitness Fridays  ?Upcoming Education:  ?Freezing and Fall Prevention in Parkinson's.  Wednesday, April 12th at 1:00 pm ?Understanding Gene and Cell-Based Therapies in Parkinson's.  Wednesday, May 10th at 1:00 pm ?Register for expert briefings (webinars) at [email or web address] ?Please check out their website to sign up for emails and see their full online offerings ? ? ?[place]:  [email or web address]  ?Third Thursday Webinars:  On the third Thursday of every month at 12 p.m. ET, join our free live webinars to learn about various aspects of living with Parkinson's disease and our work to speed medical breakthroughs. ?Upcoming Webinar:  Brainwaves:  The Potential of Focused Ultrasound and Deep Brain Stimulation in PD.  Thursday, April 20th at  12 noon. ?Check out additional information on their website to see their full online offerings ? ?[place]:  [email or web address] ?Upcoming Webinar:   Stay tuned ?Webinar Series:  Living with Parkinson's Meetup.   Third Thursdays each month, 3 pm ?  Care Partner Monthly Meetup.  With Robin Searing Phinney.  First Tuesday of each month, 2 pm ?Check out additional information to Live Well Today on their website ? ?Parkinson and Movement Disorders (PMD) Alliance:  www.pmdalliance.org ?[employer] Online:  Online Education Events ?Sign up for emails, which are sent weekly to give you updates on programming and online offerings ? ?Parkinson's Association of [place]:  www.parkinsonassociation.org ?Information on online support groups, education events, and online exercises including Yoga, Parkinson's exercises and more-LOTS of information on links to PD resources and online events ?Virtual Support Group through [employer] of the [place]; next one is scheduled for Wednesday, April 5th at 2 pm. (These are typically scheduled for the 1st Wednesday of the month at 2 pm).  Visit website for details. ?MOVEMENT AND EXERCISE OPPORTUNITIES ?Parkinson's DRUMMING Classes/Music Therapy with Doylene Canning:  This is a returning class and it's FREE!  2nd Mondays, continuing April 10th  , 11:00 at the [place].  Contact *Misty Taylor-Paladino at [email or web address] or Doylene Canning at [phone] or [email or web address]  ?PWR! Moves Classes at [place].  Wednesdays 10 and 11 am.   Contact Amy Gerrit Friends, PT [email or web address] if interested. ?NEW PWR! Moves Class offering at [employer].  Wednesdays 1-2 pm, starting April 12th.  Contact Bryson Dames, Acupuncturist at [employer].  [email or web address] [email or web address] ?Here is a link to the PWR!Moves classes on Zoom from [place] - Daily Mon-Sat at 10:00. Via Zoom, FREE and open to all.  There is also a link below via Facebook if you use that  platform. ? ?[email or web address].si ?[email or web address]://[email or web address].no ? ?Parkinson's Wellness Recovery (PWR! Moves)  www.[email or web address] ?Info on the PWR! Virtual Experience:  You will have access to our expertise through self-assessment, guided plans that start with the PD-specific fundamentals, educational content, tips, Q&A with an expert, and a growing Art therapist of PD-specific pre-recorded and live exercise classes of varying types and intensity - both physical and cognitive! If that is not enough, we offer 1:1 wellness consultations (in-person or virtual) to personalize your PWR! Research scientist (medical).  ?[employer] Fridays:  ?As part of the PD Health @ Home program, this free video series focuses each week on one aspect of fitness designed to support people living with Parkinson's.  These weekly videos highlight the [place] recent fitness guidelines for people with Parkinson's disease. ?www.KVTVnet.com.cy ?Dance for PD website is offering free, live-stream classes throughout the week, as well as links to [employer] of classes:  https://[email or web address]/ ?Dance for Parkinson's in-person class.  February 1-April 26, Wednesdays 4-5 pm.  Free class for people with Parkinson's disease, at [address] [place], [place].  Contact [phone] or [email or web address] to register ?Virtual dance and Pilates for Parkinson's classes: Click on the Community Tab> Parkinson's Movement Initiative Tab.  To register for classes and for more information, visit www.[email or web address].co.za and click the ?community? tab.  ?[employer] Parkinson's Cycling Classes  ?[facility]:  Thursdays @ Noon-Live classes at [employer] ([employer] at [email or web address] or  [phone]) ?[employer]: Virtual Classes Mondays and Thursdays Jeanette Caprice classes Tuesday, Wednesday and Thursday (contact [place] at [email or web address]  or [phone]) ?[employer] ?Varied levels of classes are offered Mondays, Tuesdays and Thursdays at [employer].  ?Stretching with Verdis Frederickson weekly class is also offered for people with Parkinson's ?To observe a cla

## 2022-02-28 DIAGNOSIS — G20A1 Parkinson's disease without dyskinesia, without mention of fluctuations: Secondary | ICD-10-CM

## 2022-02-28 HISTORY — DX: Parkinson's disease without dyskinesia, without mention of fluctuations: G20.A1

## 2022-03-03 ENCOUNTER — Encounter: Payer: Self-pay | Admitting: Neurology

## 2022-03-03 ENCOUNTER — Other Ambulatory Visit: Payer: Self-pay

## 2022-03-03 DIAGNOSIS — G20 Parkinson's disease: Secondary | ICD-10-CM

## 2022-03-04 ENCOUNTER — Encounter (HOSPITAL_BASED_OUTPATIENT_CLINIC_OR_DEPARTMENT_OTHER): Payer: Self-pay | Admitting: Cardiology

## 2022-03-04 ENCOUNTER — Ambulatory Visit (INDEPENDENT_AMBULATORY_CARE_PROVIDER_SITE_OTHER): Payer: No Typology Code available for payment source | Admitting: Cardiology

## 2022-03-04 VITALS — BP 124/80 | HR 76 | Ht 73.0 in | Wt 207.0 lb

## 2022-03-04 DIAGNOSIS — I10 Essential (primary) hypertension: Secondary | ICD-10-CM | POA: Diagnosis not present

## 2022-03-04 DIAGNOSIS — Z7189 Other specified counseling: Secondary | ICD-10-CM | POA: Diagnosis not present

## 2022-03-04 DIAGNOSIS — R079 Chest pain, unspecified: Secondary | ICD-10-CM

## 2022-03-04 NOTE — Patient Instructions (Addendum)
Medication Instructions:  ?Your physician recommends that you continue on your current medications as directed. Please refer to the Current Medication list given to you today.  ? ?*If you need a refill on your cardiac medications before your next appointment, please call your pharmacy* ? ?Lab Work: ?NONE  ? ?Testing/Procedures: ?Your physician has requested that you have an exercise tolerance test. For further information please visit [email or web address]. Please also follow instruction sheet, as given. ? ? ?Follow-Up: ?TO BE DECIDED AFTER STRESS TEST  ? ? ? ? ? ? ?

## 2022-03-04 NOTE — Progress Notes (Signed)
?Cardiology Office Note:   ? ?Date:  03/04/2022  ? ?ID:  Casey Allen, DOB [birth year], MRN [id number] ? ?PCP:  Laurey Morale, MD  ?Cardiologist:  Buford Dresser, MD ? ?Referring MD: Laurey Morale, MD  ? ?CC: new patient consultation for chest pain ? ?History of Present Illness:   ? ?Casey Allen is a 51 y.o. male with a hx of Parkinson's disease, hypertension who is seen as a new consult at the request of Laurey Morale, MD for the evaluation and management of chest pain. ? ?Note from Dr. Sarajane Jews from 02/10/22. Noted burning chest pain with exercise that improves with rest, referred for further evaluation. ? ?Chest pain: ?-Initial onset: he is a runner. After about 5 min of running, he has a burning pain in his central chest that creeps up to his throat. Gets better when he rests but will restart if he starts running again. Doesn't happen when he bikes. Tried treating reflux with PPI, taking antacids, etc without improvement. Not changed in cold vs warm weather. ?-Associated symptoms: none ?-Aggravating/alleviating factors: running makes it worse, resting makes it better. HR 145-155 when symptoms occurring.  ?-Prior cardiac history: none ?-Prior workup: echo 2015 for palpitations ?-Prior treatment: none ?-Alcohol: 1-2 beers/week, mixed drink on the weekends ?-Tobacco: never ?-Comorbidities: hypertension ?-Exercise level: running now about 1x/month, ideally 3-4 times/week ?-Cardiac ROS: no shortness of breath, no PND, no orthopnea, no LE edema, no syncope ?-Family history:  no family history of heart issues ? ?Past Medical History:  ?Diagnosis Date  ? Allergic rhinitis   ? Allergy   ? Asthma   ? Cancer [facility])   ? skin cancer- left shoulder  ? GERD (gastroesophageal reflux disease)   ? Headache(784.0)   ? History of chickenpox   ? History of nephrolithiasis   ? Hypertension   ? Skin exam, screening for cancer   ? sees Dr. Annamary Carolin   ? ? ?Past Surgical History:  ?Procedure Laterality Date  ? COLONOSCOPY   12/06/2019  ? per Dr. Loletha Carrow, precancerous polyps, repeat in 3 yrs   ? 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 Corrie Mckusick and then had MOHS on 2/11/110 per DR. Albertini    ? 2010  ? LASIK    ? rt achilles tendon repair  09/2000  ? SHOULDER SURGERY Right   ? ? ?Current Medications: ?Current Outpatient Medications on File Prior to Visit  ?Medication Sig  ? albuterol (VENTOLIN HFA) 108 (90 Base) MCG/ACT inhaler INHALE 2 PUFFS INTO THE LUNGS EVERY 4 HOURS AS NEEDED FOR WHEEZING OR SHORTNESS OF BREATH (BEFORE EXERCISE)  ? amLODipine (NORVASC) 10 MG tablet Take 1 tablet (10 mg total) by mouth daily.  ? Black Elderberry 1.9 GM/5ML SYRP   ? Calcium-Magnesium-Vitamin D (CALCIUM 1200+D3 PO)   ? carbidopa-levodopa (SINEMET IR) 25-100 MG tablet Take 1 tablet by mouth 3 (three) times daily. 7am/11am/4pm  ? Cyanocobalamin (B-12) 2500 MCG TABS   ? Fish Oil OIL by Does not apply route.  ? fluticasone (FLONASE) 50 MCG/ACT nasal spray USE TWO SPRAYS IN EACH NOSTRIL DAILY   ? gabapentin (NEURONTIN) 100 MG capsule Take 100 mg by mouth 3 (three) times daily.  ? hydrochlorothiazide (HYDRODIURIL) 25 MG tablet Take 1 tablet (25 mg total) by mouth daily.  ? loratadine-pseudoephedrine (LORATADINE-D 24HR) 10-240 MG 24 hr tablet Take 1 tablet by mouth daily.  ? Misc Natural Products (GLUCOSAMINE CHOND COMPLEX/MSM PO) Take by mouth daily.  ? Multiple Vitamin (  MULTIVITAMIN) tablet Take 1 tablet by mouth daily.  ? omeprazole (PRILOSEC) 40 MG capsule Take 1 capsule (40 mg total) by mouth daily.  ? Vitamin D, Cholecalciferol, 1000 UNITS CAPS Take by mouth daily.  ? ?No current facility-administered medications on file prior to visit.  ?  ? ?Allergies:   Cats claw (uncaria tomentosa)  ? ?Social History  ? ?Tobacco Use  ? Smoking status: Never  ? Smokeless tobacco: Never  ?Vaping Use  ? Vaping Use: Never used  ?Substance Use Topics  ? Alcohol use: Yes  ?  Alcohol/week: 0.0 standard drinks  ?  Comment: 5-6  beers per week  ? Drug use: No  ? ? ?Family History: ?family history includes Dementia in his father; Diabetes in his father; Hypertension in his father and another family member; Lung cancer in an other family member; Myasthenia gravis in his sister. There is no history of Colon cancer, Esophageal cancer, Rectal cancer, or Stomach cancer. ? ?ROS:   ?Please see the history of present illness.  Additional pertinent ROS: ?Constitutional: Negative for chills, fever, night sweats, unintentional weight loss  ?HENT: Negative for ear pain and hearing loss.   ?Eyes: Negative for loss of vision and eye pain.  ?Respiratory: Negative for cough, sputum, wheezing.   ?Cardiovascular: See HPI. ?Gastrointestinal: Negative for abdominal pain, melena, and hematochezia.  ?Genitourinary: Negative for dysuria and hematuria.  ?Musculoskeletal: Negative for falls and myalgias.  ?Skin: Negative for itching and rash.  ?Neurological: Negative for focal weakness, focal sensory changes and loss of consciousness.  ?Endo/Heme/Allergies: Does not bruise/bleed easily.   ? ? ?EKGs/Labs/Other Studies Reviewed:   ? ?The following studies were reviewed today: ?Echo 2015 ?- Left ventricle: The cavity size was normal. Systolic function was  ?  normal. The estimated ejection fraction was in the range of 60%  ?  to 65%. Wall motion was normal; there were no regional wall  ?  motion abnormalities. Left ventricular diastolic function  ?  parameters were normal.  ?- Aortic valve: Trileaflet; normal thickness leaflets. There was no  ?  regurgitation.  ?- Aortic root: The aortic root was normal in size.  ?- Mitral valve: Structurally normal valve. There was trivial  ?  regurgitation.  ?- Left atrium: The atrium was at the upper limits of normal in  ?  size.  ?- Right ventricle: Systolic function was normal.  ?- Right atrium: The atrium was normal in size.  ?- Tricuspid valve: There was no regurgitation.  ?- Pulmonary arteries: Systolic pressure was within the  normal  ?  range.  ?- Pericardium, extracardiac: There was no pericardial effusion.  ? ?Impressions:  ? ?- Normal study.  ? ?EKG:  EKG is personally reviewed.   ?03/04/22: NSR at 76 bpm ? ?Recent Labs: ?02/10/2022: ALT 13; BUN 16; Creatinine, Ser 1.30; Hemoglobin 13.6; Platelets 230.0; Potassium 3.7; Sodium 140; TSH 0.80  ?Recent Lipid Panel ?   ?Component Value Date/Time  ? CHOL 245 (H) 02/10/2022 0945  ? TRIG 60.0 02/10/2022 0945  ? HDL 83.60 02/10/2022 0945  ? CHOLHDL 3 02/10/2022 0945  ? VLDL 12.0 02/10/2022 0945  ? [place] 149 (H) 02/10/2022 0945  ? LDLDIRECT 127.9 09/06/2008 0925  ? ? ?Physical Exam:   ? ?VS:  BP 124/80   Pulse 76   Ht '6\' 1"'$  (1.854 m)   Wt 207 lb (93.9 kg)   SpO2 99%   BMI 27.31 kg/m?    ? ?Wt Readings from Last 3 Encounters:  ?03/04/22 207  lb (93.9 kg)  ?02/27/22 207 lb 12.8 oz (94.3 kg)  ?02/10/22 206 lb 9.6 oz (93.7 kg)  ?  ?GEN: Well nourished, well developed in no acute distress ?HEENT: Normal, moist mucous membranes ?NECK: No JVD ?CARDIAC: regular rhythm, normal S1 and S2, no rubs or gallops. Very brief 1/6 systolic ejection murmur. ?VASCULAR: Radial and DP pulses 2+ bilaterally. No carotid bruits ?RESPIRATORY:  Clear to auscultation without rales, wheezing or rhonchi  ?ABDOMEN: Soft, non-tender, non-distended ?MUSCULOSKELETAL:  Ambulates independently ?SKIN: Warm and dry, no edema ?NEUROLOGIC:  Alert and oriented x 3. No focal neuro deficits noted. ?PSYCHIATRIC:  Normal affect   ? ?ASSESSMENT:   ? ?1. Chest pain of uncertain etiology   ?2. Primary hypertension   ?3. Cardiac risk counseling   ?4. Counseling on health promotion and disease prevention   ? ?PLAN:   ? ?Chest pain: ?-discussed treadmill stress, nuclear stress/lexiscan, and CT coronary angiography. Discussed pros and cons of each, including but not limited to false positive/false negative risk, radiation risk, and risk of IV contrast dye. Based on shared decision making, decision was made to pursue exercise treadmill stress  test. If abnormal, would pursue CT coronary ? ?Hypertension ?-at goal today ?-continue amlodipine, HCTZ ? ?Cardiac risk counseling and prevention recommendations: ?-recommend heart healthy/Mediterranean diet, with whol

## 2022-03-13 ENCOUNTER — Telehealth (HOSPITAL_COMMUNITY): Payer: Self-pay | Admitting: *Deleted

## 2022-03-13 NOTE — Telephone Encounter (Signed)
Close encounter 

## 2022-03-14 ENCOUNTER — Ambulatory Visit (HOSPITAL_COMMUNITY)
Admission: RE | Admit: 2022-03-14 | Discharge: 2022-03-14 | Disposition: A | Discharge status: Home / Self Care | Payer: No Typology Code available for payment source | Source: Ambulatory Visit | Attending: Cardiology | Admitting: Cardiology

## 2022-03-14 DIAGNOSIS — R079 Chest pain, unspecified: Secondary | ICD-10-CM | POA: Diagnosis present

## 2022-03-14 DIAGNOSIS — I10 Essential (primary) hypertension: Secondary | ICD-10-CM

## 2022-03-15 LAB — EXERCISE TOLERANCE TEST
Angina Index: 0
Duke Treadmill Score: 11
Estimated workload: 13
Exercise duration (min): 10 min
Exercise duration (sec): 47 s
MPHR: 168 {beats}/min
Peak HR: 162 {beats}/min
Percent HR: 96 %
Rest HR: 85 {beats}/min
ST Depression (mm): 0 mm

## 2022-03-25 ENCOUNTER — Encounter (HOSPITAL_BASED_OUTPATIENT_CLINIC_OR_DEPARTMENT_OTHER): Payer: Self-pay

## 2022-04-07 ENCOUNTER — Encounter: Payer: Self-pay | Admitting: Neurology

## 2022-04-07 MED ORDER — ONDANSETRON HCL 4 MG PO TABS: 4.0000 mg | ORAL_TABLET | Freq: Three times a day (TID) | ORAL | 2 refills | Status: DC | PRN

## 2022-04-09 ENCOUNTER — Encounter: Payer: Self-pay | Admitting: Neurology

## 2022-05-13 ENCOUNTER — Ambulatory Visit (HOSPITAL_BASED_OUTPATIENT_CLINIC_OR_DEPARTMENT_OTHER)
Payer: No Typology Code available for payment source | Attending: Orthopaedic Surgery | Admitting: Rehabilitative and Restorative Service Providers"

## 2022-05-13 ENCOUNTER — Encounter (HOSPITAL_BASED_OUTPATIENT_CLINIC_OR_DEPARTMENT_OTHER): Payer: Self-pay | Admitting: Rehabilitative and Restorative Service Providers"

## 2022-05-13 DIAGNOSIS — M25612 Stiffness of left shoulder, not elsewhere classified: Secondary | ICD-10-CM | POA: Insufficient documentation

## 2022-05-13 DIAGNOSIS — M25512 Pain in left shoulder: Secondary | ICD-10-CM | POA: Diagnosis present

## 2022-05-13 DIAGNOSIS — M6281 Muscle weakness (generalized): Secondary | ICD-10-CM | POA: Insufficient documentation

## 2022-05-13 NOTE — Therapy (Signed)
OUTPATIENT PHYSICAL THERAPY SHOULDER EVALUATION   Patient Name: Casey Allen MRN: [id number] DOB:27-Aug-1971, 51 y.o., male Today's Date: 05/13/2022   PT End of Session - 05/13/22 0900     Visit Number 1    Number of Visits 24    Date for PT Re-Evaluation 07/08/22    PT Start Time 0800    PT Stop Time 0843    PT Time Calculation (min) 43 min    Activity Tolerance Patient tolerated treatment well    Behavior During Therapy [facility] for tasks assessed/performed             Past Medical History:  Diagnosis Date   Allergic rhinitis    Allergy    Asthma    Cancer ([place])    skin cancer- left shoulder   GERD (gastroesophageal reflux disease)    Headache(784.0)    History of chickenpox    History of nephrolithiasis    Hypertension    Skin exam, screening for cancer    sees Dr. Annamary Carolin    Past Surgical History:  Procedure Laterality Date   COLONOSCOPY  12/06/2019   per Dr. 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 Corrie Mckusick and then had MOHS on 2/11/110 per DR. 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   Chronic cough 08/23/2021   Exercise-induced asthma 04/14/2018   HTN (hypertension) 06/09/2014   LIPOMA 09/18/2008   ALLERGIC RHINITIS 07/28/2008   GERD 07/28/2008   HEADACHE 07/28/2008   NEPHROLITHIASIS, HX OF 07/28/2008    PCP: Gertie Fey   REFERRING PROVIDER: Dr Ophelia Charter   REFERRING DIAG: Left Lysis of adhesions and biceps tenodesis   THERAPY DIAG:  Acute pain of left shoulder  Stiffness of left shoulder, not elsewhere classified  Muscle weakness (generalized)  Rationale for Evaluation and Treatment Rehabilitation  ONSET DATE: 05/11/2022  SUBJECTIVE:                                                                                                                                                                                       SUBJECTIVE STATEMENT: The patient began having left shoulder pain about 8 months ago. He was found to have tearing of his proximal biceps. He had a tenodesis and lysis of adhesions on 05/11/2022. His pain is well controlled at this time.  PERTINENT HISTORY: Left shoulder skin cancer 2011;    PAIN:  Are you having pain? Yes: NPRS scale:  1/10 at worst. Aching right now  Pain location: Distal biceps area  Pain description: acing  Aggravating factors: use of the shoulder  Relieving factors: rest and pain medication   PRECAUTIONS: Shoulder follow protocol   WEIGHT BEARING RESTRICTIONS Yes NWB follow protocol   FALLS:  Has patient fallen in last 6 months? 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[employer]; Hitting a heavy bag  PLOF: Independent  PATIENT GOALS   Being able to use his left arm again   OBJECTIVE:   DIAGNOSTIC FINDINGS:  Nothing post op   PATIENT SURVEYS:  FOTO    COGNITION:  Overall cognitive status: Within functional limits for tasks assessed     SENSATION: WFL block has work off   POSTURE: Good posture   UPPER EXTREMITY ROM:   Passive ROM Right eval Left eval  Shoulder flexion  80  Shoulder extension    Shoulder abduction    Shoulder adduction    Shoulder internal rotation    Shoulder external rotation  15   Elbow flexion    Elbow extension    Wrist flexion    Wrist extension    Wrist ulnar deviation    Wrist radial deviation    Wrist pronation    Wrist supination    (Blank rows = not tested)  UPPER EXTREMITY MMT:  MMT Right eval Left eval  Shoulder flexion    Shoulder extension    Shoulder abduction    Shoulder adduction    Shoulder internal rotation    Shoulder external rotation    Middle trapezius    Lower  trapezius    Elbow flexion    Elbow extension    Wrist flexion    Wrist extension    Wrist ulnar deviation    Wrist radial deviation    Wrist pronation    Wrist supination    Grip strength (lbs)    (Blank rows = not tested) Not tested 2nd recent surgery    JOINT MOBILITY TESTING:  Mildly guarded end feel   PALPATION:     TODAY'S TREATMENT:                 Exercises - Seated Shoulder Flexion Towel Slide at Table Top  - 3 x daily - 7 x weekly - 3 sets - 10 reps - Circular Shoulder Pendulum with Table Support  - 31 x daily - 7 x weekly - 3 sets - 10 reps - Supine Shoulder External Rotation in 45 Degrees Abduction AAROM with Dowel  - 3 x daily - 7 x weekly - 3 sets - 10 reps   PATIENT EDUCATION: Education details: reviewed HEP and symptom management; progression of activity  Person educated: Patient Education method: Consulting civil engineer, Demonstration, Tactile cues, Verbal cues, and Handouts Education comprehension: verbalized understanding, returned demonstration, verbal cues required, tactile cues required, and needs further education   HOME EXERCISE PROGRAM: Access Code: [email or web address] URL: [email or web address] Date: 05/13/2022 Prepared by: Carolyne Littles  Exercises - Seated Shoulder Flexion Towel Slide at Table Top  - 3 x daily - 7 x weekly - 3 sets - 10 reps - Circular Shoulder Pendulum with Table Support  - 31 x daily - 7 x weekly - 3 sets - 10 reps - Supine Shoulder External Rotation in 45 Degrees Abduction AAROM with Dowel  - 3 x daily - 7  x weekly - 3 sets - 10 reps  ASSESSMENT:  CLINICAL IMPRESSION: Patient is a 51 y.o.male who was seen today for physical therapy evaluation and treatment for S/P  . Patient rpesents with expected limitation in motion, strength, and function. His pain is well controlled. He would benefit from skilled therapy to return to active lifestyle and to improve functional ROM    OBJECTIVE IMPAIRMENTS decreased activity tolerance,  decreased ROM, decreased strength, impaired UE functional use, and pain.   ACTIVITY LIMITATIONS sleeping, dressing, and reach over head  PARTICIPATION LIMITATIONS: meal prep, cleaning, laundry, community activity, occupation, and yard work  PERSONAL FACTORS None   REHAB POTENTIAL: Excellent  CLINICAL DECISION MAKING: Stable/uncomplicated  EVALUATION COMPLEXITY: Low   GOALS: Goals reviewed with patient? Yes  SHORT TERM GOALS: Target date: 06/10/2022  (Remove Blue Hyperlink)  Patient will increase passive shoulder ER to 45 degrees  Baseline: Goal status: INITIAL  2.  Patient will increase passive shoulder flexion to 130 degrees  Baseline:  Goal status: INITIAL  3.  Patient will be independent with base exercise program per protocol  Baseline:  Goal status: INITIAL  LONG TERM GOALS: Target date: 07/08/2022    Patient will be return to the gym without pain  Baseline:  Goal status: INITIAL  2.  Patient will demonstrate 80% of strength between left and right in order ot perfrom ADL's  Baseline:  Goal status: INITIAL  3.  Patient will use his arm for all functional tasks without pain  Baseline:  Goal status: INITIAL   PLAN: PT FREQUENCY: 3x/week 2nd to manip   PT DURATION: 6 weeks  PLANNED INTERVENTIONS: Therapeutic exercises, Therapeutic activity, Neuromuscular re-education, Balance training, Gait training, Patient/Family education, Joint mobilization, Aquatic Therapy, Dry Needling, Cryotherapy, Moist heat, Taping, Ultrasound, and Manual therapy  PLAN FOR NEXT SESSION:  PROM ->AAROM- PROM follow Dr Wenda Low protocol tenodesis but also be aware he had a manip and lysis of adhesions . Check with Waunita Schooner or Willow Ora, PT 05/13/2022, 9:28 AM

## 2022-05-16 ENCOUNTER — Ambulatory Visit (HOSPITAL_BASED_OUTPATIENT_CLINIC_OR_DEPARTMENT_OTHER): Payer: No Typology Code available for payment source | Admitting: Physical Therapy

## 2022-05-16 ENCOUNTER — Encounter (HOSPITAL_BASED_OUTPATIENT_CLINIC_OR_DEPARTMENT_OTHER): Payer: Self-pay | Admitting: Physical Therapy

## 2022-05-16 DIAGNOSIS — M25512 Pain in left shoulder: Secondary | ICD-10-CM | POA: Diagnosis not present

## 2022-05-16 DIAGNOSIS — M25612 Stiffness of left shoulder, not elsewhere classified: Secondary | ICD-10-CM

## 2022-05-16 DIAGNOSIS — M6281 Muscle weakness (generalized): Secondary | ICD-10-CM

## 2022-05-16 NOTE — Therapy (Signed)
OUTPATIENT PHYSICAL THERAPY SHOULDER EVALUATION   Patient Name: Casey Allen MRN: [id number] DOB:September 07, 1971, 51 y.o., male Today's Date: 05/16/2022   PT End of Session - 05/16/22 1127     Visit Number 2    Number of Visits 24    Date for PT Re-Evaluation 07/08/22    PT Start Time 0800    PT Stop Time 0833    PT Time Calculation (min) 33 min    Activity Tolerance Patient tolerated treatment well    Behavior During Therapy [facility] for tasks assessed/performed              Past Medical History:  Diagnosis Date   Allergic rhinitis    Allergy    Asthma    Cancer ([place])    skin cancer- left shoulder   GERD (gastroesophageal reflux disease)    Headache(784.0)    History of chickenpox    History of nephrolithiasis    Hypertension    Skin exam, screening for cancer    sees Dr. Annamary Carolin    Past Surgical History:  Procedure Laterality Date   COLONOSCOPY  12/06/2019   per Dr. 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 Corrie Mckusick and then had MOHS on 2/11/110 per DR. 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   Chronic cough 08/23/2021   Exercise-induced asthma 04/14/2018   HTN (hypertension) 06/09/2014   LIPOMA 09/18/2008   ALLERGIC RHINITIS 07/28/2008   GERD 07/28/2008   HEADACHE 07/28/2008   NEPHROLITHIASIS, HX OF 07/28/2008    PCP: Gertie Fey   REFERRING PROVIDER: Dr Ophelia Charter   REFERRING DIAG: Left Lysis of adhesions and biceps tenodesis   THERAPY DIAG:  Acute pain of left shoulder  Stiffness of left shoulder, not elsewhere classified  Muscle weakness (generalized)  Rationale for Evaluation and Treatment Rehabilitation  ONSET DATE: 05/11/2022  SUBJECTIVE:                                                                                                                                                                                       SUBJECTIVE STATEMENT: Patient reports he has been feeling good since eval. He had some mild soreness at end ranges when stretching. He has been doing light activities  The patient began having left shoulder pain about 8 months ago. He was found  to have tearing of his proximal biceps. He had a tenodesis and lysis of adhesions on 05/11/2022. His pain is well controlled at this time.   PERTINENT HISTORY: Left shoulder skin cancer 2011;    PAIN:  Are you having pain? Yes: NPRS scale:  1/10 at worst. Aching right now  Pain location: Distal biceps area  Pain description: acing  Aggravating factors: use of the shoulder  Relieving factors: rest and pain medication   PRECAUTIONS: Shoulder follow protocol   WEIGHT BEARING RESTRICTIONS Yes NWB follow protocol   FALLS:  Has patient fallen in last 6 months? 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[employer]; Hitting a heavy bag  PLOF: Independent  PATIENT GOALS   Being able to use his left arm again   OBJECTIVE:   DIAGNOSTIC FINDINGS:  Nothing post op   PATIENT SURVEYS:  FOTO    COGNITION:  Overall cognitive status: Within functional limits for tasks assessed     SENSATION: WFL block has work off   POSTURE: Good posture   UPPER EXTREMITY ROM:   Passive ROM Right eval Left eval  Shoulder flexion  80  Shoulder extension    Shoulder abduction    Shoulder adduction    Shoulder internal rotation    Shoulder external rotation  15   Elbow flexion    Elbow extension    Wrist flexion    Wrist extension    Wrist ulnar deviation    Wrist radial deviation    Wrist pronation    Wrist supination    (Blank rows = not tested)  UPPER EXTREMITY MMT:  MMT Right eval Left eval  Shoulder flexion    Shoulder  extension    Shoulder abduction    Shoulder adduction    Shoulder internal rotation    Shoulder external rotation    Middle trapezius    Lower trapezius    Elbow flexion    Elbow extension    Wrist flexion    Wrist extension    Wrist ulnar deviation    Wrist radial deviation    Wrist pronation    Wrist supination    Grip strength (lbs)    (Blank rows = not tested) Not tested 2nd recent surgery    JOINT MOBILITY TESTING:  Mildly guarded end feel   PALPATION:     TODAY'S TREATMENT:               7/14 PROM into all planes of the shoulder gentle PROM into flexion /extension  Self Passive flexion extension of the elbow 3x10  Scap retraction 3x10  Wand flexion 3x10  Wand er 2x10  Scap retraction 3x10    Eval   Exercises - Seated Shoulder Flexion Towel Slide at Table Top  - 3 x daily - 7 x weekly - 3 sets - 10 reps - Circular Shoulder Pendulum with Table Support  - 31 x daily - 7 x weekly - 3 sets - 10 reps - Supine Shoulder External Rotation in 45 Degrees Abduction AAROM with Dowel  - 3 x daily - 7 x weekly - 3 sets - 10 reps   PATIENT EDUCATION: Education details: reviewed HEP and symptom management; progression of activity  Person educated: Patient Education method: Consulting civil engineer, Demonstration, Tactile cues, Verbal cues, and Handouts Education comprehension: verbalized understanding, returned demonstration, verbal cues required, tactile cues required, and needs further education   HOME EXERCISE PROGRAM: Access Code: [email or web address] URL: [email or web address] Date: 05/13/2022 Prepared by:  Carolyne Littles  Exercises - Seated Shoulder Flexion Towel Slide at Table Top  - 3 x daily - 7 x weekly - 3 sets - 10 reps - Circular Shoulder Pendulum with Table Support  - 31 x daily - 7 x weekly - 3 sets - 10 reps - Supine Shoulder External Rotation in 45 Degrees Abduction AAROM with Dowel  - 3 x daily - 7 x weekly - 3 sets - 10 reps  ASSESSMENT:  CLINICAL  IMPRESSION: The patient is making excellent progress. He tolerated ROM with very little pain. He was advised even though he is doing very early in the program and he needs to be very cautions with his ROM. Therapy contacted MD but has not heard about whether he should be in his sling or not. He was advised not to use his shoulder too much at this time. We began light active assisted activity today and peri-scapular strengthening. He was given an updated HEP.  OBJECTIVE IMPAIRMENTS decreased activity tolerance, decreased ROM, decreased strength, impaired UE functional use, and pain.   ACTIVITY LIMITATIONS sleeping, dressing, and reach over head  PARTICIPATION LIMITATIONS: meal prep, cleaning, laundry, community activity, occupation, and yard work  PERSONAL FACTORS None   REHAB POTENTIAL: Excellent  CLINICAL DECISION MAKING: Stable/uncomplicated  EVALUATION COMPLEXITY: Low   GOALS: Goals reviewed with patient? Yes  SHORT TERM GOALS: Target date: 06/10/2022  (Remove Blue Hyperlink)  Patient will increase passive shoulder ER to 45 degrees  Baseline: Goal status: INITIAL  2.  Patient will increase passive shoulder flexion to 130 degrees  Baseline:  Goal status: INITIAL  3.  Patient will be independent with base exercise program per protocol  Baseline:  Goal status: INITIAL  LONG TERM GOALS: Target date: 07/08/2022    Patient will be return to the gym without pain  Baseline:  Goal status: INITIAL  2.  Patient will demonstrate 80% of strength between left and right in order ot perfrom ADL's  Baseline:  Goal status: INITIAL  3.  Patient will use his arm for all functional tasks without pain  Baseline:  Goal status: INITIAL   PLAN: PT FREQUENCY: 3x/week 2nd to manip   PT DURATION: 6 weeks  PLANNED INTERVENTIONS: Therapeutic exercises, Therapeutic activity, Neuromuscular re-education, Balance training, Gait training, Patient/Family education, Joint mobilization, Aquatic  Therapy, Dry Needling, Cryotherapy, Moist heat, Taping, Ultrasound, and Manual therapy  PLAN FOR NEXT SESSION:  PROM ->AAROM- PROM follow Dr Wenda Low protocol tenodesis but also be aware he had a manip and lysis of adhesions . Check with Waunita Schooner or Willow Ora, PT 05/16/2022, 11:29 AM   Jolene Schimke SPT  05/16/2022  During this treatment session, the therapist was present, participating in and directing the treatment.

## 2022-05-20 ENCOUNTER — Ambulatory Visit (HOSPITAL_BASED_OUTPATIENT_CLINIC_OR_DEPARTMENT_OTHER): Payer: No Typology Code available for payment source | Admitting: Physical Therapy

## 2022-05-20 DIAGNOSIS — M25512 Pain in left shoulder: Secondary | ICD-10-CM | POA: Diagnosis not present

## 2022-05-20 DIAGNOSIS — M6281 Muscle weakness (generalized): Secondary | ICD-10-CM

## 2022-05-20 DIAGNOSIS — M25612 Stiffness of left shoulder, not elsewhere classified: Secondary | ICD-10-CM

## 2022-05-20 NOTE — Therapy (Signed)
OUTPATIENT PHYSICAL THERAPY SHOULDER EVALUATION   Patient Name: Casey Allen MRN: [id number] DOB:May 19, 1971, 51 y.o., male Today's Date: 05/20/2022      Past Medical History:  Diagnosis Date   Allergic rhinitis    Allergy    Asthma    Cancer ([place])    skin cancer- left shoulder   GERD (gastroesophageal reflux disease)    Headache(784.0)    History of chickenpox    History of nephrolithiasis    Hypertension    Skin exam, screening for cancer    sees Dr. Annamary Carolin    Past Surgical History:  Procedure Laterality Date   COLONOSCOPY  12/06/2019   per Dr. 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 Corrie Mckusick and then had MOHS on 2/11/110 per DR. 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  Rationale for Evaluation and Treatment Rehabilitation  ONSET DATE: 05/11/2022  SUBJECTIVE:                                                                                                                                                                                      SUBJECTIVE STATEMENT: The patient has been doing well. Minor soreness as time but no major issues. He has been to the MD who is happy with his progress.  He has been adherent to his HEP.  PERTINENT HISTORY: Left shoulder skin cancer 2011;     PAIN:  Are you having pain? Yes: NPRS scale:  1/10 at worst. Aching right now  Pain location: Distal biceps area  Pain description: acing  Aggravating factors: use of the shoulder  Relieving factors: rest and pain medication   PRECAUTIONS: Shoulder follow protocol   WEIGHT BEARING RESTRICTIONS Yes NWB follow protocol   FALLS:  Has patient fallen in last 6 months? No  LIVING ENVIRONMENT: Nothing pertinent  OCCUPATION: Works in Engineer, technical sales  Hobbies: [employer]; Hitting a heavy bag  PLOF: Independent  PATIENT  GOALS   Being able to use his left arm again   OBJECTIVE:   DIAGNOSTIC FINDINGS:  Nothing post op   PATIENT SURVEYS:  FOTO    COGNITION:  Overall cognitive status: Within functional limits for tasks assessed     SENSATION: WFL block has work off   POSTURE: Good posture   UPPER EXTREMITY ROM:   Passive ROM Right eval Left eval  Shoulder flexion  80  Shoulder extension    Shoulder abduction    Shoulder adduction    Shoulder internal rotation    Shoulder external rotation  15   Elbow flexion    Elbow extension    Wrist flexion    Wrist extension    Wrist ulnar deviation    Wrist radial deviation    Wrist pronation    Wrist supination    (Blank rows = not tested)  UPPER EXTREMITY MMT:  MMT Right eval Left eval  Shoulder flexion    Shoulder extension    Shoulder abduction    Shoulder adduction    Shoulder internal rotation    Shoulder external rotation    Middle trapezius    Lower trapezius    Elbow flexion    Elbow extension    Wrist flexion    Wrist extension    Wrist ulnar deviation    Wrist radial deviation    Wrist pronation    Wrist supination    Grip strength (lbs)    (Blank rows = not tested) Not tested 2nd recent surgery    JOINT MOBILITY TESTING:  Mildly guarded end feel   PALPATION:     TODAY'S TREATMENT:   7/18 Shoulder PROM in flexion, abd, ER as tolerated with Grade I/II AP/inf mobs              Prone  row to neutral 2x15    Prone extension to neutral 2x10    Prone horizontal abd to neutral, 2x10    7/14   PROM into all planes of the shoulder gentle PROM into flexion /extension    Self Passive flexion extension of the elbow 3x10    Scap retraction 3x10    Wand flexion 3x10    Wand er 2x10    Scap retraction 3x10      Eval     Exercises - Seated Shoulder Flexion Towel Slide at Table Top  - 3 x daily - 7 x weekly - 3 sets - 10 reps - Circular Shoulder Pendulum with Table Support  - 31 x daily - 7 x weekly - 3 sets - 10 reps - Supine Shoulder External Rotation in 45 Degrees Abduction AAROM with Dowel  - 3 x daily - 7 x weekly - 3 sets - 10 reps   PATIENT EDUCATION: Education details: reviewed HEP and symptom management; progression of activity  Person educated: Patient Education method: Explanation, Demonstration, Tactile cues, Verbal cues, and Handouts Education comprehension: verbalized understanding, returned demonstration, verbal cues required, tactile cues required, and needs further education   HOME EXERCISE PROGRAM: Access Code: [email or web address] URL: [email or web address] Date: 05/13/2022 Prepared by: Carolyne Littles  Exercises - Seated Shoulder Flexion Towel Slide at Table Top  - 3 x daily - 7 x weekly - 3 sets - 10 reps - Circular Shoulder Pendulum with Table Support  - 31 x daily - 7 x weekly - 3 sets - 10 reps - Supine Shoulder External Rotation in 45 Degrees Abduction AAROM with Dowel  - 3 x daily - 7 x weekly -  3 sets - 10 reps  ASSESSMENT:  CLINICAL IMPRESSION: The patient continues to progress well. His PROM is improving without pain, with most limitations in abduction. We were able to add peri-scapular strengthening in a prone position with no pain or difficulty. He displays good scapular stability. He was advised to continue with his HEP as tolerated. He will continue to benefit from skilled therapy to progress per protocol to return to  function.  OBJECTIVE IMPAIRMENTS decreased activity tolerance, decreased ROM, decreased strength, impaired UE functional use, and pain.   ACTIVITY LIMITATIONS sleeping, dressing, and reach over head  PARTICIPATION LIMITATIONS: meal prep, cleaning, laundry, community activity, occupation, and yard work  PERSONAL FACTORS None   REHAB POTENTIAL: Excellent  CLINICAL DECISION MAKING: Stable/uncomplicated  EVALUATION COMPLEXITY: Low   GOALS: Goals reviewed with patient? Yes  SHORT TERM GOALS: Target date: 06/10/2022   Patient will increase passive shoulder ER to 45 degrees  Baseline: Goal status: INITIAL  2.  Patient will increase passive shoulder flexion to 130 degrees  Baseline:  Goal status: INITIAL  3.  Patient will be independent with base exercise program per protocol  Baseline:  Goal status: INITIAL  LONG TERM GOALS: Target date: 07/08/2022    Patient will be return to the gym without pain  Baseline:  Goal status: INITIAL  2.  Patient will demonstrate 80% of strength between left and right in order ot perfrom ADL's  Baseline:  Goal status: INITIAL  3.  Patient will use his arm for all functional tasks without pain  Baseline:  Goal status: INITIAL   PLAN: PT FREQUENCY: 3x/week 2nd to manip   PT DURATION: 6 weeks  PLANNED INTERVENTIONS: Therapeutic exercises, Therapeutic activity, Neuromuscular re-education, Balance training, Gait training, Patient/Family education, Joint mobilization, Aquatic Therapy, Dry Needling, Cryotherapy, Moist heat, Taping, Ultrasound, and Manual therapy  PLAN FOR NEXT SESSION:  PROM ->AAROM as tolerated, continue periscap strength. Follow Dr Wenda Low protocol tenodesis but also be aware he had a manip and lysis of adhesions . Check with Waunita Schooner or Willow Ora, PT 05/20/2022, 8:56 AM   Jolene Schimke SPT  05/16/2022  During this treatment session, the therapist was present, participating in and directing the treatment.

## 2022-05-23 ENCOUNTER — Encounter (HOSPITAL_BASED_OUTPATIENT_CLINIC_OR_DEPARTMENT_OTHER): Payer: Self-pay | Admitting: Physical Therapy

## 2022-05-23 ENCOUNTER — Ambulatory Visit (HOSPITAL_BASED_OUTPATIENT_CLINIC_OR_DEPARTMENT_OTHER): Payer: No Typology Code available for payment source | Admitting: Physical Therapy

## 2022-05-23 DIAGNOSIS — M25512 Pain in left shoulder: Secondary | ICD-10-CM | POA: Diagnosis not present

## 2022-05-23 DIAGNOSIS — M6281 Muscle weakness (generalized): Secondary | ICD-10-CM

## 2022-05-23 DIAGNOSIS — M25612 Stiffness of left shoulder, not elsewhere classified: Secondary | ICD-10-CM

## 2022-05-23 NOTE — Therapy (Signed)
OUTPATIENT PHYSICAL THERAPY SHOULDER EVALUATION   Patient Name: Casey Allen MRN: [id number] DOB:[birth year], 51 y.o., male Today's Date: 05/23/2022   PT End of Session - 05/23/22 1044     Visit Number 4    Number of Visits 24    Date for PT Re-Evaluation 07/08/22    PT Start Time 0845    PT Stop Time 0924    PT Time Calculation (min) 39 min    Activity Tolerance Patient tolerated treatment well    Behavior During Therapy [facility] for tasks assessed/performed               Past Medical History:  Diagnosis Date   Allergic rhinitis    Allergy    Asthma    Cancer ([place])    skin cancer- left shoulder   GERD (gastroesophageal reflux disease)    Headache(784.0)    History of chickenpox    History of nephrolithiasis    Hypertension    Skin exam, screening for cancer    sees Dr. Annamary Carolin    Past Surgical History:  Procedure Laterality Date   COLONOSCOPY  12/06/2019   per Dr. 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 Corrie Mckusick and then had MOHS on 2/11/110 per DR. 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   Chronic cough 08/23/2021   Exercise-induced asthma 04/14/2018   HTN (hypertension) 06/09/2014   LIPOMA 09/18/2008   ALLERGIC RHINITIS 07/28/2008   GERD 07/28/2008   HEADACHE 07/28/2008   NEPHROLITHIASIS, HX OF 07/28/2008    PCP: Gertie Fey   REFERRING PROVIDER: Dr Ophelia Charter   REFERRING DIAG: Left Lysis of adhesions and biceps tenodesis   THERAPY DIAG:  Acute pain of left shoulder  Stiffness of left shoulder, not elsewhere classified  Muscle weakness (generalized)  Rationale for Evaluation and Treatment Rehabilitation  ONSET DATE: 05/11/2022  SUBJECTIVE:                                                                                                                                                                                       SUBJECTIVE STATEMENT: The patient has been doing well. Minor soreness as time but no major issues. He has been to the MD who is happy with his progress.  He has been adherent to his HEP.  PERTINENT HISTORY: Left shoulder  skin cancer 2011;    PAIN:  Are you having pain? Yes: NPRS scale:  1/10 at worst. Aching right now  Pain location: Distal biceps area  Pain description: acing  Aggravating factors: use of the shoulder  Relieving factors: rest and pain medication   PRECAUTIONS: Shoulder follow protocol   WEIGHT BEARING RESTRICTIONS Yes NWB follow protocol   FALLS:  Has patient fallen in last 6 months? No  LIVING ENVIRONMENT: Nothing pertinent  OCCUPATION: Works in IT  Hobbies: [employer]; Hitting a heavy bag  PLOF: Independent  PATIENT GOALS   Being able to use his left arm again   OBJECTIVE:   DIAGNOSTIC FINDINGS:  Nothing post op   PATIENT SURVEYS:  FOTO    COGNITION:  Overall cognitive status: Within functional limits for tasks assessed     SENSATION: WFL block has work off   POSTURE: Good posture   UPPER EXTREMITY ROM:   Passive ROM Right eval Left eval  Shoulder flexion  80  Shoulder extension    Shoulder abduction    Shoulder adduction    Shoulder internal rotation    Shoulder external rotation  15   Elbow flexion    Elbow extension    Wrist flexion    Wrist extension    Wrist ulnar deviation    Wrist radial deviation    Wrist pronation    Wrist supination    (Blank rows = not tested)  UPPER EXTREMITY MMT:  MMT Right eval Left eval  Shoulder flexion    Shoulder extension    Shoulder abduction    Shoulder adduction    Shoulder internal rotation    Shoulder external rotation    Middle trapezius    Lower trapezius    Elbow flexion    Elbow extension     Wrist flexion    Wrist extension    Wrist ulnar deviation    Wrist radial deviation    Wrist pronation    Wrist supination    Grip strength (lbs)    (Blank rows = not tested) Not tested 2nd recent surgery    JOINT MOBILITY TESTING:  Mildly guarded end feel   PALPATION:     TODAY'S TREATMENT:  7/21 Shoulder PROM in flexion, abd, ER as tolerated with Grade I/II AP/inf mobs Seated AAROM 2x10  Supine AARO flexion 2x10  Band extension to neutral 2x12 red  Banded row 2x12 red   Sidelying ER 2x10      7/18 Shoulder PROM in flexion, abd, ER as tolerated with Grade I/II AP/inf mobs              Prone row to neutral 2x15    Prone extension to neutral 2x10    Prone horizontal abd to neutral, 2x10    7/14   PROM into all planes of the shoulder gentle PROM into flexion /extension    Self Passive flexion extension of the elbow 3x10    Scap retraction 3x10    Wand flexion 3x10    Wand er 2x10    Scap retraction 3x10      Eval     Exercises - Seated Shoulder Flexion Towel Slide at Table Top  - 3 x daily - 7 x weekly - 3 sets - 10 reps - Circular Shoulder Pendulum with Table Support  - 31 x daily - 7 x weekly - 3 sets - 10 reps - Supine Shoulder External Rotation in 45 Degrees Abduction AAROM with Dowel  - 3 x daily -  7 x weekly - 3 sets - 10 reps   PATIENT EDUCATION: Education details: reviewed HEP and symptom management; progression of activity  Person educated: Patient Education method: Explanation, Demonstration, Tactile cues, Verbal cues, and Handouts Education comprehension: verbalized understanding, returned demonstration, verbal cues required, tactile cues required, and needs further education   HOME EXERCISE PROGRAM: Access Code: [email or web address] URL: [email or web address] Date: 05/13/2022 Prepared by: Carolyne Littles  Exercises - Seated Shoulder Flexion Towel Slide at Table Top  - 3 x daily - 7 x weekly - 3 sets - 10 reps - Circular Shoulder  Pendulum with Table Support  - 31 x daily - 7 x weekly - 3 sets - 10 reps - Supine Shoulder External Rotation in 45 Degrees Abduction AAROM with Dowel  - 3 x daily - 7 x weekly - 3 sets - 10 reps  ASSESSMENT:  CLINICAL IMPRESSION: The patient is making good progress. He had a little more soreness today, but overall he tolerated treatment well. We added active ER and advanced his scapular strengthening. He had no pain. Overall he is making progress. We will continue to progress per Biceps tenodesis protocol.   OBJECTIVE IMPAIRMENTS decreased activity tolerance, decreased ROM, decreased strength, impaired UE functional use, and pain.   ACTIVITY LIMITATIONS sleeping, dressing, and reach over head  PARTICIPATION LIMITATIONS: meal prep, cleaning, laundry, community activity, occupation, and yard work  PERSONAL FACTORS None   REHAB POTENTIAL: Excellent  CLINICAL DECISION MAKING: Stable/uncomplicated  EVALUATION COMPLEXITY: Low   GOALS: Goals reviewed with patient? Yes  SHORT TERM GOALS: Target date: 06/10/2022   Patient will increase passive shoulder ER to 45 degrees  Baseline: Goal status: INITIAL  2.  Patient will increase passive shoulder flexion to 130 degrees  Baseline:  Goal status: INITIAL  3.  Patient will be independent with base exercise program per protocol  Baseline:  Goal status: INITIAL  LONG TERM GOALS: Target date: 07/08/2022    Patient will be return to the gym without pain  Baseline:  Goal status: INITIAL  2.  Patient will demonstrate 80% of strength between left and right in order ot perfrom ADL's  Baseline:  Goal status: INITIAL  3.  Patient will use his arm for all functional tasks without pain  Baseline:  Goal status: INITIAL   PLAN: PT FREQUENCY: 3x/week 2nd to manip   PT DURATION: 6 weeks  PLANNED INTERVENTIONS: Therapeutic exercises, Therapeutic activity, Neuromuscular re-education, Balance training, Gait training, Patient/Family education,  Joint mobilization, Aquatic Therapy, Dry Needling, Cryotherapy, Moist heat, Taping, Ultrasound, and Manual therapy  PLAN FOR NEXT SESSION:  PROM ->AAROM as tolerated, continue periscap strength. Follow Dr Wenda Low protocol tenodesis but also be aware he had a manip and lysis of adhesions . Check with Waunita Schooner or Willow Ora, PT 05/23/2022, 10:46 AM   Jolene Schimke SPT  05/16/2022  During this treatment session, the therapist was present, participating in and directing the treatment.

## 2022-05-29 ENCOUNTER — Ambulatory Visit (HOSPITAL_BASED_OUTPATIENT_CLINIC_OR_DEPARTMENT_OTHER): Payer: No Typology Code available for payment source | Admitting: Physical Therapy

## 2022-05-29 ENCOUNTER — Encounter (HOSPITAL_BASED_OUTPATIENT_CLINIC_OR_DEPARTMENT_OTHER): Payer: Self-pay | Admitting: Physical Therapy

## 2022-05-29 DIAGNOSIS — M25512 Pain in left shoulder: Secondary | ICD-10-CM | POA: Diagnosis not present

## 2022-05-29 DIAGNOSIS — M25612 Stiffness of left shoulder, not elsewhere classified: Secondary | ICD-10-CM

## 2022-05-29 DIAGNOSIS — M6281 Muscle weakness (generalized): Secondary | ICD-10-CM

## 2022-05-29 NOTE — Therapy (Signed)
OUTPATIENT PHYSICAL THERAPY SHOULDER EVALUATION   Patient Name: Casey Allen MRN: [id number] DOB:[birth year], 51 y.o., male Today's Date: 05/29/2022       Past Medical History:  Diagnosis Date   Allergic rhinitis    Allergy    Asthma    Cancer ([place])    skin cancer- left shoulder   GERD (gastroesophageal reflux disease)    Headache(784.0)    History of chickenpox    History of nephrolithiasis    Hypertension    Skin exam, screening for cancer    sees Dr. Annamary Carolin    Past Surgical History:  Procedure Laterality Date   COLONOSCOPY  12/06/2019   per Dr. 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 Corrie Mckusick and then had MOHS on 2/11/110 per DR. 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  Rationale for Evaluation and Treatment Rehabilitation  ONSET DATE: 05/11/2022  SUBJECTIVE:                                                                                                                                                                                      SUBJECTIVE STATEMENT: The patient is 2.5 weeks post op from a biceps tenodesis The patient reports it has been achy. It has been aching when he sits for a while and when he sleeps on it. He was advised it may be a little early to sleep on it.    PERTINENT HISTORY: Left shoulder skin cancer 2011;    PAIN:  Are you having pain? Yes: NPRS scale:  1/10 at worst. Aching right now  Pain location: Distal biceps area  Pain description: acing  Aggravating factors: use of the shoulder  Relieving factors: rest and pain medication   PRECAUTIONS: Shoulder follow protocol   WEIGHT BEARING RESTRICTIONS Yes NWB follow protocol   FALLS:  Has patient fallen in last 6 months? No  LIVING ENVIRONMENT: Nothing pertinent  OCCUPATION: Works  in IT  Hobbies: [employer]; Hitting a heavy bag  PLOF: Independent  PATIENT GOALS   Being able to use his left arm again   OBJECTIVE:   DIAGNOSTIC FINDINGS:  Nothing post op   PATIENT SURVEYS:  FOTO    COGNITION:  Overall cognitive status: Within functional limits for tasks assessed     SENSATION: WFL block has work off   POSTURE: Good posture   UPPER EXTREMITY ROM:   Passive ROM Right eval Left eval  Shoulder flexion  80  Shoulder extension    Shoulder abduction    Shoulder adduction    Shoulder internal rotation    Shoulder external rotation  15   Elbow flexion    Elbow extension    Wrist flexion    Wrist extension    Wrist ulnar deviation    Wrist radial deviation    Wrist pronation    Wrist supination    (Blank rows = not tested)  UPPER EXTREMITY MMT:  MMT Right eval Left eval  Shoulder flexion    Shoulder extension    Shoulder abduction    Shoulder adduction    Shoulder internal rotation    Shoulder external rotation    Middle trapezius    Lower trapezius    Elbow flexion    Elbow extension    Wrist flexion    Wrist extension    Wrist ulnar deviation    Wrist radial deviation    Wrist pronation    Wrist supination    Grip strength (lbs)    (Blank rows = not tested) Not tested 2nd recent surgery    JOINT MOBILITY TESTING:  Mildly guarded end feel   PALPATION:     TODAY'S TREATMENT:  7/27 Shoulder PROM in flexion, abd, ER as  tolerated with Grade I/II AP/inf mobs    Prone row to neutral 2x15    Prone extension to neutral 2x10    Prone horizontal abd to neutral, 2x10  Supine AAROM flexion 2x10 wand  Supine flexion without wand to 90 degrees   Pulleys flexion x10   Table walk you x10 with ball       7/21 Shoulder PROM in flexion, abd, ER as tolerated with Grade I/II AP/inf mobs Seated AAROM 2x10  Supine AARO flexion 2x10  Band extension to neutral 2x12 red  Banded row 2x12 red   Sidelying ER 2x10      7/18 Shoulder PROM in flexion, abd, ER as tolerated with Grade I/II AP/inf mobs              Prone row to neutral 2x15    Prone extension to neutral 2x10    Prone horizontal abd to neutral, 2x10    7/14   PROM into all planes of the shoulder gentle PROM into flexion /extension    Self Passive flexion extension of the elbow 3x10    Scap retraction 3x10    Wand flexion 3x10    Wand er 2x10    Scap retraction 3x10      Eval     Exercises - Seated Shoulder Flexion Towel Slide at Table Top  - 3 x daily - 7 x weekly - 3 sets - 10 reps - Circular Shoulder Pendulum with Table Support  - 31 x daily - 7 x weekly - 3 sets - 10 reps - Supine Shoulder External Rotation in 45 Degrees Abduction AAROM with Dowel  - 3 x daily - 7 x weekly - 3 sets - 10  reps   PATIENT EDUCATION: Education details: reviewed HEP and symptom management; progression of activity  Person educated: Patient Education method: Explanation, Demonstration, Tactile cues, Verbal cues, and Handouts Education comprehension: verbalized understanding, returned demonstration, verbal cues required, tactile cues required, and needs further education   HOME EXERCISE PROGRAM: Access Code: [email or web address] URL: [email or web address] Date: 05/13/2022 Prepared by: Carolyne Littles  Exercises - Seated Shoulder Flexion Towel Slide at Table Top  - 3 x daily - 7 x weekly - 3 sets - 10 reps - Circular Shoulder Pendulum with Table  Support  - 31 x daily - 7 x weekly - 3 sets - 10 reps - Supine Shoulder External Rotation in 45 Degrees Abduction AAROM with Dowel  - 3 x daily - 7 x weekly - 3 sets - 10 reps  ASSESSMENT:  CLINICAL IMPRESSION: The patient continues to make good progress. His passive ER as mildly painful at end ranges but improved with manual therapy. His flexion and scaption were full today. We reviewed his prone exercises for home. We focused on active assisted exercises today for his shoulder. He tolerated well. We added the ABC to his home program. We updated his HEP. Therapy will continue to progress as tolerated.   OBJECTIVE IMPAIRMENTS decreased activity tolerance, decreased ROM, decreased strength, impaired UE functional use, and pain.   ACTIVITY LIMITATIONS sleeping, dressing, and reach over head  PARTICIPATION LIMITATIONS: meal prep, cleaning, laundry, community activity, occupation, and yard work  PERSONAL FACTORS None   REHAB POTENTIAL: Excellent  CLINICAL DECISION MAKING: Stable/uncomplicated  EVALUATION COMPLEXITY: Low   GOALS: Goals reviewed with patient? Yes  SHORT TERM GOALS: Target date: 06/10/2022   Patient will increase passive shoulder ER to 45 degrees  Baseline: Goal status: INITIAL  2.  Patient will increase passive shoulder flexion to 130 degrees  Baseline:  Goal status: INITIAL  3.  Patient will be independent with base exercise program per protocol  Baseline:  Goal status: INITIAL  LONG TERM GOALS: Target date: 07/08/2022    Patient will be return to the gym without pain  Baseline:  Goal status: INITIAL  2.  Patient will demonstrate 80% of strength between left and right in order ot perfrom ADL's  Baseline:  Goal status: INITIAL  3.  Patient will use his arm for all functional tasks without pain  Baseline:  Goal status: INITIAL   PLAN: PT FREQUENCY: 3x/week 2nd to manip   PT DURATION: 6 weeks  PLANNED INTERVENTIONS: Therapeutic exercises, Therapeutic  activity, Neuromuscular re-education, Balance training, Gait training, Patient/Family education, Joint mobilization, Aquatic Therapy, Dry Needling, Cryotherapy, Moist heat, Taping, Ultrasound, and Manual therapy  PLAN FOR NEXT SESSION:  PROM ->AAROM as tolerated, continue periscap strength. Follow Dr Wenda Low protocol tenodesis but also be aware he had a manip and lysis of adhesions . Check with Waunita Schooner or Willow Ora, PT 05/29/2022, 8:45 AM   Jolene Schimke SPT  05/16/2022  During this treatment session, the therapist was present, participating in and directing the treatment.

## 2022-06-02 ENCOUNTER — Encounter: Payer: Self-pay | Admitting: Neurology

## 2022-06-02 ENCOUNTER — Encounter (HOSPITAL_BASED_OUTPATIENT_CLINIC_OR_DEPARTMENT_OTHER): Payer: No Typology Code available for payment source | Admitting: Physical Therapy

## 2022-06-03 ENCOUNTER — Other Ambulatory Visit: Payer: Self-pay

## 2022-06-03 ENCOUNTER — Telehealth: Payer: Self-pay

## 2022-06-03 MED ORDER — CARBIDOPA-LEVODOPA ER 25-100 MG PO TBCR: EXTENDED_RELEASE_TABLET | ORAL | 1 refills | Status: DC

## 2022-06-03 NOTE — Telephone Encounter (Signed)
Pt called an informed that Dr Tat would like for him to stop the carbidopa/levodopa IR for now and call in carbidopa/levodopa 25/100 CR and take 1 po tid at 7am/11am/4pm and see if he does better.

## 2022-06-05 ENCOUNTER — Ambulatory Visit (HOSPITAL_BASED_OUTPATIENT_CLINIC_OR_DEPARTMENT_OTHER): Payer: No Typology Code available for payment source | Attending: Orthopaedic Surgery | Admitting: Physical Therapy

## 2022-06-05 DIAGNOSIS — M25612 Stiffness of left shoulder, not elsewhere classified: Secondary | ICD-10-CM | POA: Insufficient documentation

## 2022-06-05 DIAGNOSIS — M25512 Pain in left shoulder: Secondary | ICD-10-CM | POA: Diagnosis present

## 2022-06-05 DIAGNOSIS — M6281 Muscle weakness (generalized): Secondary | ICD-10-CM | POA: Diagnosis present

## 2022-06-05 NOTE — Therapy (Signed)
OUTPATIENT PHYSICAL THERAPY SHOULDER EVALUATION   Patient Name: Casey Allen MRN: [id number] DOB:01/29/1971, 51 y.o., male Today's Date: 06/06/2022   PT End of Session - 06/06/22 0836     Visit Number 6    Number of Visits 24    Date for PT Re-Evaluation 07/08/22    PT Start Time 0845    PT Stop Time 0927    PT Time Calculation (min) 42 min    Activity Tolerance Patient tolerated treatment well    Behavior During Therapy [facility] for tasks assessed/performed                Past Medical History:  Diagnosis Date   Allergic rhinitis    Allergy    Asthma    Cancer ([place])    skin cancer- left shoulder   GERD (gastroesophageal reflux disease)    Headache(784.0)    History of chickenpox    History of nephrolithiasis    Hypertension    Skin exam, screening for cancer    sees Dr. Annamary Carolin    Past Surgical History:  Procedure Laterality Date   COLONOSCOPY  12/06/2019   per Dr. 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 Corrie Mckusick and then had MOHS on 2/11/110 per DR. 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   Chronic cough 08/23/2021   Exercise-induced asthma 04/14/2018   HTN (hypertension) 06/09/2014   LIPOMA 09/18/2008   ALLERGIC RHINITIS 07/28/2008   GERD 07/28/2008   HEADACHE 07/28/2008   NEPHROLITHIASIS, HX OF 07/28/2008    PCP: Gertie Fey   REFERRING PROVIDER: Dr Ophelia Charter   REFERRING DIAG: Left Lysis of adhesions and biceps tenodesis   THERAPY DIAG:  Acute pain of left shoulder  Stiffness of left shoulder, not elsewhere classified  Muscle weakness (generalized)  Rationale for Evaluation and Treatment Rehabilitation  ONSET DATE: 05/11/2022  SUBJECTIVE:                                                                                                                                                                                       SUBJECTIVE STATEMENT: The patient continues to report minor soreness. He reports it is mostly at night when he sleeps on it. This is to be expected at this time.   PERTINENT HISTORY: Left shoulder skin cancer 2011; parkinson's  PAIN:  Are you having pain? Yes: NPRS scale:  1/10 at worst. Aching right now  Pain location: Distal biceps area  Pain description: acing  Aggravating factors: use of the shoulder  Relieving factors: rest and pain medication   PRECAUTIONS: Shoulder follow protocol   WEIGHT BEARING RESTRICTIONS Yes NWB follow protocol   FALLS:  Has patient fallen in last 6 months? No  LIVING ENVIRONMENT: Nothing pertinent  OCCUPATION: Works in IT  Hobbies: [employer]; Hitting a heavy bag  PLOF: Independent  PATIENT GOALS   Being able to use his left arm again   OBJECTIVE:   DIAGNOSTIC FINDINGS:  Nothing post op   PATIENT SURVEYS:  FOTO    COGNITION:  Overall cognitive status: Within functional limits for tasks assessed     SENSATION: WFL block has work off   POSTURE: Good posture   UPPER EXTREMITY ROM:   Passive ROM Right eval Left eval  Shoulder flexion  80  Shoulder extension    Shoulder abduction    Shoulder adduction    Shoulder internal rotation    Shoulder external rotation  15   Elbow flexion    Elbow extension    Wrist flexion    Wrist extension    Wrist ulnar deviation    Wrist radial deviation    Wrist pronation    Wrist supination    (Blank rows = not tested)  UPPER EXTREMITY MMT:  MMT Right eval Left eval  Shoulder flexion    Shoulder extension    Shoulder abduction    Shoulder adduction    Shoulder internal rotation    Shoulder external rotation    Middle trapezius    Lower trapezius    Elbow flexion    Elbow extension    Wrist  flexion    Wrist extension    Wrist ulnar deviation    Wrist radial deviation    Wrist pronation    Wrist supination    Grip strength (lbs)    (Blank rows = not tested) Not tested 2nd recent surgery    JOINT MOBILITY TESTING:  Mildly guarded end feel   PALPATION:     TODAY'S TREATMENT:  8/3 Shoulder PROM in flexion, abd, ER as tolerated with Grade II/III AP/inf mobs    Prone row to neutral 2x15    Prone extension to neutral 2x10    Prone horizontal abd to neutral, 2x10  Supine AAROM flexion 2x10 wand 2lb wand  ABC 1x 1lb 1x 2lb   Ball wall walk 2x10 -   7/27 Shoulder PROM in flexion, abd, ER as tolerated with Grade I/II AP/inf mobs    Prone row to neutral 2x15    Prone extension to neutral 2x10    Prone horizontal abd to neutral, 2x10  SL ER 2x10    Supine AAROM flexion 2x10 wand  Supine flexion without wand to 90 degrees   Pulleys flexion 2 min scaption 2 min   Table walk you x10 with ball       7/21 Shoulder PROM in flexion, abd, ER as tolerated with Grade I/II AP/inf mobs Seated AAROM 2x10  Supine AARO flexion 2x10  Band extension to neutral 2x12 red  Banded row 2x12 red   Sidelying ER 2x10      7/18 Shoulder PROM in flexion, abd, ER as tolerated with Grade I/II AP/inf mobs              Prone row to neutral 2x15    Prone extension to  neutral 2x10    Prone horizontal abd to neutral, 2x10    7/14   PROM into all planes of the shoulder gentle PROM into flexion /extension    Self Passive flexion extension of the elbow 3x10    Scap retraction 3x10    Wand flexion 3x10    Wand er 2x10    Scap retraction 3x10      Eval     Exercises - Seated Shoulder Flexion Towel Slide at Table Top  - 3 x daily - 7 x weekly - 3 sets - 10 reps - Circular Shoulder Pendulum with Table Support  - 31 x daily - 7 x weekly - 3 sets - 10 reps - Supine Shoulder External Rotation in 45 Degrees Abduction AAROM with Dowel  - 3 x daily - 7 x weekly - 3 sets - 10  reps   PATIENT EDUCATION: Education details: reviewed HEP and symptom management; progression of activity  Person educated: Patient Education method: Explanation, Demonstration, Tactile cues, Verbal cues, and Handouts Education comprehension: verbalized understanding, returned demonstration, verbal cues required, tactile cues required, and needs further education   HOME EXERCISE PROGRAM: Access Code: [email or web address] URL: [email or web address] Date: 05/13/2022 Prepared by: Carolyne Littles  Exercises - Seated Shoulder Flexion Towel Slide at Table Top  - 3 x daily - 7 x weekly - 3 sets - 10 reps - Circular Shoulder Pendulum with Table Support  - 31 x daily - 7 x weekly - 3 sets - 10 reps - Supine Shoulder External Rotation in 45 Degrees Abduction AAROM with Dowel  - 3 x daily - 7 x weekly - 3 sets - 10 reps  ASSESSMENT:  CLINICAL IMPRESSION: The patient has nearly full ROM at this time. He had full active flexion at the end. He reports some endurance issue with his daily tasks. We will continue with endurance exercise. We will continue to progress as tolerated.   OBJECTIVE IMPAIRMENTS decreased activity tolerance, decreased ROM, decreased strength, impaired UE functional use, and pain.   ACTIVITY LIMITATIONS sleeping, dressing, and reach over head  PARTICIPATION LIMITATIONS: meal prep, cleaning, laundry, community activity, occupation, and yard work  PERSONAL FACTORS None   REHAB POTENTIAL: Excellent  CLINICAL DECISION MAKING: Stable/uncomplicated  EVALUATION COMPLEXITY: Low   GOALS: Goals reviewed with patient? Yes  SHORT TERM GOALS: Target date: 06/10/2022   Patient will increase passive shoulder ER to 45 degrees  Baseline: Goal status: INITIAL  2.  Patient will increase passive shoulder flexion to 130 degrees  Baseline:  Goal status: INITIAL  3.  Patient will be independent with base exercise program per protocol  Baseline:  Goal status: INITIAL  LONG TERM  GOALS: Target date: 07/08/2022    Patient will be return to the gym without pain  Baseline:  Goal status: INITIAL  2.  Patient will demonstrate 80% of strength between left and right in order ot perfrom ADL's  Baseline:  Goal status: INITIAL  3.  Patient will use his arm for all functional tasks without pain  Baseline:  Goal status: INITIAL   PLAN: PT FREQUENCY: 3x/week 2nd to manip   PT DURATION: 6 weeks  PLANNED INTERVENTIONS: Therapeutic exercises, Therapeutic activity, Neuromuscular re-education, Balance training, Gait training, Patient/Family education, Joint mobilization, Aquatic Therapy, Dry Needling, Cryotherapy, Moist heat, Taping, Ultrasound, and Manual therapy  PLAN FOR NEXT SESSION:  PROM ->AAROM as tolerated, continue periscap strength. Follow Dr Wenda Low protocol tenodesis but also be aware he had a manip and lysis of adhesions . Check  with Waunita Schooner or Willow Ora, PT 06/06/2022, 8:41 AM

## 2022-06-06 ENCOUNTER — Encounter (HOSPITAL_BASED_OUTPATIENT_CLINIC_OR_DEPARTMENT_OTHER): Payer: Self-pay | Admitting: Physical Therapy

## 2022-06-09 ENCOUNTER — Encounter (HOSPITAL_BASED_OUTPATIENT_CLINIC_OR_DEPARTMENT_OTHER): Payer: Self-pay | Admitting: Physical Therapy

## 2022-06-09 ENCOUNTER — Ambulatory Visit (HOSPITAL_BASED_OUTPATIENT_CLINIC_OR_DEPARTMENT_OTHER): Payer: No Typology Code available for payment source | Admitting: Physical Therapy

## 2022-06-09 DIAGNOSIS — M25612 Stiffness of left shoulder, not elsewhere classified: Secondary | ICD-10-CM

## 2022-06-09 DIAGNOSIS — M6281 Muscle weakness (generalized): Secondary | ICD-10-CM

## 2022-06-09 DIAGNOSIS — M25512 Pain in left shoulder: Secondary | ICD-10-CM

## 2022-06-09 NOTE — Therapy (Signed)
OUTPATIENT PHYSICAL THERAPY SHOULDER EVALUATION   Patient Name: Casey Allen MRN: [id number] DOB:09-Dec-1970, 51 y.o., male Today's Date: 06/09/2022   PT End of Session - 06/09/22 0851     Visit Number 7    Number of Visits 24    Date for PT Re-Evaluation 07/08/22    PT Start Time 0847    PT Stop Time 0928    PT Time Calculation (min) 41 min    Activity Tolerance Patient tolerated treatment well    Behavior During Therapy [facility] for tasks assessed/performed                Past Medical History:  Diagnosis Date   Allergic rhinitis    Allergy    Asthma    Cancer ([place])    skin cancer- left shoulder   GERD (gastroesophageal reflux disease)    Headache(784.0)    History of chickenpox    History of nephrolithiasis    Hypertension    Skin exam, screening for cancer    sees Dr. Annamary Carolin    Past Surgical History:  Procedure Laterality Date   COLONOSCOPY  12/06/2019   per Dr. 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 Corrie Mckusick and then had MOHS on 2/11/110 per DR. 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   Chronic cough 08/23/2021   Exercise-induced asthma 04/14/2018   HTN (hypertension) 06/09/2014   LIPOMA 09/18/2008   ALLERGIC RHINITIS 07/28/2008   GERD 07/28/2008   HEADACHE 07/28/2008   NEPHROLITHIASIS, HX OF 07/28/2008    PCP: Gertie Fey   REFERRING PROVIDER: Dr Ophelia Charter   REFERRING DIAG: Left Lysis of adhesions and biceps tenodesis   THERAPY DIAG:  Acute pain of left shoulder  Stiffness of left shoulder, not elsewhere classified  Muscle weakness (generalized)  Rationale for Evaluation and Treatment Rehabilitation  ONSET DATE: 05/11/2022  SUBJECTIVE:                                                                                                                                                                                       SUBJECTIVE STATEMENT: The patient continues to report minor soreness. He reports it is mostly at night when he sleeps on it. This is to be expected at this time.   PERTINENT HISTORY: Left shoulder skin cancer 2011; parkinson's  PAIN:  Are you having pain? Yes: NPRS scale:  1/10 at worst. Aching right now  Pain location: Distal biceps area  Pain description: acing  Aggravating factors: use of the shoulder  Relieving factors: rest and pain medication   PRECAUTIONS: Shoulder follow protocol   WEIGHT BEARING RESTRICTIONS Yes NWB follow protocol   FALLS:  Has patient fallen in last 6 months? No  LIVING ENVIRONMENT: Nothing pertinent  OCCUPATION: Works in IT  Hobbies: [employer]; Hitting a heavy bag  PLOF: Independent  PATIENT GOALS   Being able to use his left arm again   OBJECTIVE:   DIAGNOSTIC FINDINGS:  Nothing post op   PATIENT SURVEYS:  FOTO    COGNITION:  Overall cognitive status: Within functional limits for tasks assessed     SENSATION: WFL block has work off   POSTURE: Good posture   UPPER EXTREMITY ROM:   Passive ROM Right eval Left eval  Shoulder flexion  80  Shoulder extension    Shoulder abduction    Shoulder adduction    Shoulder internal rotation    Shoulder external rotation  15   Elbow flexion    Elbow extension    Wrist flexion    Wrist extension    Wrist ulnar deviation    Wrist radial deviation    Wrist pronation    Wrist supination    (Blank rows = not tested)  UPPER EXTREMITY MMT:  MMT Right eval Left eval  Shoulder flexion    Shoulder extension    Shoulder abduction    Shoulder adduction    Shoulder internal rotation    Shoulder external rotation    Middle trapezius    Lower trapezius    Elbow flexion    Elbow extension    Wrist  flexion    Wrist extension    Wrist ulnar deviation    Wrist radial deviation    Wrist pronation    Wrist supination    Grip strength (lbs)    (Blank rows = not tested) Not tested 2nd recent surgery    JOINT MOBILITY TESTING:  Mildly guarded end feel   PALPATION:     TODAY'S TREATMENT:  8/7 Shoulder PROM in flexion, abd, ER as tolerated with Grade II/III AP/inf mobs   Supine AAROM flexion 2x10 wand 2lb wand  ABC 1x 1lb 1x 2lb   Prone row to neutral 2x15 2lbs    Prone extension to neutral 2x10 2lbs    Prone horizontal abd to neutral, 2x10  SL ER 2lbs 2x15   Standing shoulder flexion x15   8/3 Shoulder PROM in flexion, abd, ER as tolerated with Grade II/III AP/inf mobs    Prone row to neutral 2x15    Prone extension to neutral 2x10    Prone horizontal abd to neutral, 2x10  Supine AAROM flexion 2x10 wand 2lb wand  ABC 1x 1lb 1x 2lb   Ball wall walk 2x10 -   7/27 Shoulder PROM in flexion, abd, ER as tolerated with Grade I/II AP/inf mobs    Prone row to neutral 2x15    Prone extension to neutral 2x10    Prone horizontal abd to neutral, 2x10  SL ER 2x10    Supine AAROM flexion 2x10 wand  Supine flexion without wand to 90 degrees   Pulleys flexion 2 min scaption 2 min   Table walk you x10 with ball       7/21 Shoulder PROM in flexion, abd, ER as tolerated with Grade I/II AP/inf mobs  Seated AAROM 2x10  Supine AARO flexion 2x10  Band extension to neutral 2x12 red  Banded row 2x12 red   Sidelying ER 2x10       PATIENT EDUCATION: Education details: reviewed HEP and symptom management; progression of activity  Person educated: Patient Education method: Explanation, Demonstration, Tactile cues, Verbal cues, and Handouts Education comprehension: verbalized understanding, returned demonstration, verbal cues required, tactile cues required, and needs further education   HOME EXERCISE PROGRAM: Access Code: [email or web address] URL:  [email or web address] Date: 05/13/2022 Prepared by: Carolyne Littles  Exercises - Seated Shoulder Flexion Towel Slide at Table Top  - 3 x daily - 7 x weekly - 3 sets - 10 reps - Circular Shoulder Pendulum with Table Support  - 31 x daily - 7 x weekly - 3 sets - 10 reps - Supine Shoulder External Rotation in 45 Degrees Abduction AAROM with Dowel  - 3 x daily - 7 x weekly - 3 sets - 10 reps  ASSESSMENT:  CLINICAL IMPRESSION: The patient had mild pain in a scaption plane. We worked it a little with some weight and he tolerated well. He did have more of a trigger point in his upper trap today. He was having some baseline soreness in his upper trap.He continued to work the same [employer] he did last visit. Therapy will continue to progress as tolerated. Therapy added standing flexion. The patient is making good progress.  OBJECTIVE IMPAIRMENTS decreased activity tolerance, decreased ROM, decreased strength, impaired UE functional use, and pain.   ACTIVITY LIMITATIONS sleeping, dressing, and reach over head  PARTICIPATION LIMITATIONS: meal prep, cleaning, laundry, community activity, occupation, and yard work  PERSONAL FACTORS None   REHAB POTENTIAL: Excellent  CLINICAL DECISION MAKING: Stable/uncomplicated  EVALUATION COMPLEXITY: Low   GOALS: Goals reviewed with patient? Yes  SHORT TERM GOALS: Target date: 06/10/2022   Patient will increase passive shoulder ER to 45 degrees  Baseline: Goal status: INITIAL  2.  Patient will increase passive shoulder flexion to 130 degrees  Baseline:  Goal status: INITIAL  3.  Patient will be independent with base exercise program per protocol  Baseline:  Goal status: INITIAL  LONG TERM GOALS: Target date: 07/08/2022    Patient will be return to the gym without pain  Baseline:  Goal status: INITIAL  2.  Patient will demonstrate 80% of strength between left and right in order ot perfrom ADL's  Baseline:  Goal status: INITIAL  3.   Patient will use his arm for all functional tasks without pain  Baseline:  Goal status: INITIAL   PLAN: PT FREQUENCY: 3x/week 2nd to manip   PT DURATION: 6 weeks  PLANNED INTERVENTIONS: Therapeutic exercises, Therapeutic activity, Neuromuscular re-education, Balance training, Gait training, Patient/Family education, Joint mobilization, Aquatic Therapy, Dry Needling, Cryotherapy, Moist heat, Taping, Ultrasound, and Manual therapy  PLAN FOR NEXT SESSION:  PROM ->AAROM as tolerated, continue periscap strength. Follow Dr Wenda Low protocol tenodesis but also be aware he had a manip and lysis of adhesions . Check with Waunita Schooner or Willow Ora, PT 06/09/2022, 9:05 AM

## 2022-06-11 NOTE — Therapy (Signed)
OUTPATIENT PHYSICAL THERAPY TREATMENT NOTE   Patient Name: Casey Allen MRN: [id number] DOB:[birth year], 51 y.o., male Today's Date: 06/12/2022   PT End of Session - 06/12/22 0853     Visit Number 8    Number of Visits 24    Date for PT Re-Evaluation 07/08/22    PT Start Time 0805    PT Stop Time 0850    PT Time Calculation (min) 45 min    Activity Tolerance Patient tolerated treatment well    Behavior During Therapy [facility] for tasks assessed/performed                 Past Medical History:  Diagnosis Date   Allergic rhinitis    Allergy    Asthma    Cancer ([place])    skin cancer- left shoulder   GERD (gastroesophageal reflux disease)    Headache(784.0)    History of chickenpox    History of nephrolithiasis    Hypertension    Skin exam, screening for cancer    sees Dr. Annamary Carolin    Past Surgical History:  Procedure Laterality Date   COLONOSCOPY  12/06/2019   per Dr. 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 Corrie Mckusick and then had MOHS on 2/11/110 per DR. 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   Chronic cough 08/23/2021   Exercise-induced asthma 04/14/2018   HTN (hypertension) 06/09/2014   LIPOMA 09/18/2008   ALLERGIC RHINITIS 07/28/2008   GERD 07/28/2008   HEADACHE 07/28/2008   NEPHROLITHIASIS, HX OF 07/28/2008    PCP: Gertie Fey   REFERRING PROVIDER: Dr Ophelia Charter   REFERRING DIAG: Left Lysis of adhesions and biceps tenodesis   THERAPY DIAG:  Acute pain of left shoulder  Stiffness of left shoulder, not elsewhere classified  Muscle weakness (generalized)  Rationale for Evaluation and Treatment Rehabilitation  ONSET DATE: 05/11/2022  SUBJECTIVE:                                                                                                                                                                                       SUBJECTIVE STATEMENT: Pt is 4 weeks and 4 days s/p L shoulder biceps tenodesis and lysis of adhesions.  Pt reports no specific improvements though things are becoming easier to do.  Pt denies any adverse effects after prior  Rx.  Pt has some soreness/pain if he lies on shoulder while sleeping which happens most nights.  Pt reports compliance with HEP.  PERTINENT HISTORY: Left shoulder skin cancer 2011; parkinson's    PAIN:  Are you having pain? Yes: NPRS scale:  1/10 pain currently though can be 3-4/10 if he moves it wrong.   Pain location: Distal biceps area  Pain description: acing  Aggravating factors: use of the shoulder  Relieving factors: rest and pain medication   PRECAUTIONS: Shoulder follow protocol   WEIGHT BEARING RESTRICTIONS Yes NWB follow protocol   FALLS:  Has patient fallen in last 6 months? No  LIVING ENVIRONMENT: Nothing pertinent  OCCUPATION: Works in IT  Hobbies: [employer]; Hitting a heavy bag  PLOF: Independent  PATIENT GOALS   Being able to use his left arm again   OBJECTIVE:   DIAGNOSTIC FINDINGS:  Nothing post op   PATIENT SURVEYS:  FOTO       TODAY'S TREATMENT:  L shoulder ROM: Flexion in supine:  AAROM:  144 deg, AROM:  138 deg ER AROM/PROM:  45/47  Therapeutic Exercise:   Reviewed current function, HEP compliance, response to prior Rx, and pain level. Assess L shoulder ROM.   Pt performed:  Supine AAROM flexion 2x10 wand 2lb wand  Supine flexion AROM x10 reps  Supine serratus punch 2x10  Prone row to neutral 2x15 with YTB   Prone extension to neutral 2x10 with YTB     Prone horizontal abd to neutral 2x10     Manual Therapy: Pt received Grade II/III AP/inf mobs to improve pain, ROM, and to normalize arthrokinematics f/b L Shoulder PROM in flexion, abd,  ER, and IR per pt and tissue tolerance.      PATIENT EDUCATION: Education details: reviewed HEP and symptom management; progression of activity  Person educated: Patient Education method: Explanation, Demonstration, Tactile cues, Verbal cues, and Handouts Education comprehension: verbalized understanding, returned demonstration, verbal cues required, tactile cues required, and needs further education   HOME EXERCISE PROGRAM: Access Code: [email or web address] URL: [email or web address] Date: 05/13/2022 Prepared by: Carolyne Littles  Exercises - Seated Shoulder Flexion Towel Slide at Table Top  - 3 x daily - 7 x weekly - 3 sets - 10 reps - Circular Shoulder Pendulum with Table Support  - 31 x daily - 7 x weekly - 3 sets - 10 reps - Supine Shoulder External Rotation in 45 Degrees Abduction AAROM with Dowel  - 3 x daily - 7 x weekly - 3 sets - 10 reps  ASSESSMENT:  CLINICAL IMPRESSION: Pt is making good progress with shoulder ROM and with protocol.  Pt demonstrates improved ROM t/o L shoulder as evidenced by goniometric measurements.  Pt able to perform supine flexion AROM well without c/o's.  He performed exercises per protocol well without c/o's.  He responded well to Rx having no increased pain after Rx.  Pt should benefit from cont skilled PT services per protocol to address ongoing goals and to assist in restoring desired level of function.      OBJECTIVE IMPAIRMENTS decreased activity tolerance, decreased ROM, decreased strength, impaired UE functional use, and pain.   ACTIVITY LIMITATIONS sleeping, dressing, and reach over head  PARTICIPATION LIMITATIONS: meal prep, cleaning, laundry, community activity, occupation, and yard work  PERSONAL FACTORS None   REHAB POTENTIAL: Excellent  CLINICAL DECISION MAKING: Stable/uncomplicated  EVALUATION COMPLEXITY: Low   GOALS: Goals reviewed with patient? Yes  SHORT TERM GOALS: Target date: 06/10/2022   Patient will increase  passive shoulder ER to 45 degrees  Baseline: Goal status: INITIAL  2.  Patient will increase passive shoulder flexion to 130 degrees  Baseline:  Goal status: INITIAL  3.  Patient will be independent with base exercise program per protocol  Baseline:  Goal status: INITIAL  LONG TERM GOALS: Target date: 07/08/2022    Patient will be return to the gym without pain  Baseline:  Goal status: INITIAL  2.  Patient will demonstrate 80% of strength between left and right in order ot perfrom ADL's  Baseline:  Goal status: INITIAL  3.  Patient will use his arm for all functional tasks without pain  Baseline:  Goal status: INITIAL   PLAN: PT FREQUENCY: 3x/week 2nd to manip   PT DURATION: 6 weeks  PLANNED INTERVENTIONS: Therapeutic exercises, Therapeutic activity, Neuromuscular re-education, Balance training, Gait training, Patient/Family education, Joint mobilization, Aquatic Therapy, Dry Needling, Cryotherapy, Moist heat, Taping, Ultrasound, and Manual therapy  PLAN FOR NEXT SESSION:  PROM ->AAROM as tolerated, continue periscap strength. Follow Dr Wenda Low protocol tenodesis but also be aware he had a manip and lysis of adhesions.    Selinda Michaels III PT, DPT 06/12/22 9:39 PM

## 2022-06-12 ENCOUNTER — Encounter (HOSPITAL_BASED_OUTPATIENT_CLINIC_OR_DEPARTMENT_OTHER): Payer: Self-pay | Admitting: Physical Therapy

## 2022-06-12 ENCOUNTER — Ambulatory Visit (HOSPITAL_BASED_OUTPATIENT_CLINIC_OR_DEPARTMENT_OTHER): Payer: No Typology Code available for payment source | Admitting: Physical Therapy

## 2022-06-12 DIAGNOSIS — M6281 Muscle weakness (generalized): Secondary | ICD-10-CM

## 2022-06-12 DIAGNOSIS — M25512 Pain in left shoulder: Secondary | ICD-10-CM

## 2022-06-12 DIAGNOSIS — M25612 Stiffness of left shoulder, not elsewhere classified: Secondary | ICD-10-CM

## 2022-06-16 ENCOUNTER — Encounter (HOSPITAL_BASED_OUTPATIENT_CLINIC_OR_DEPARTMENT_OTHER): Payer: Self-pay | Admitting: Physical Therapy

## 2022-06-16 ENCOUNTER — Ambulatory Visit (HOSPITAL_BASED_OUTPATIENT_CLINIC_OR_DEPARTMENT_OTHER): Payer: No Typology Code available for payment source | Admitting: Physical Therapy

## 2022-06-16 DIAGNOSIS — M25512 Pain in left shoulder: Secondary | ICD-10-CM

## 2022-06-16 DIAGNOSIS — M6281 Muscle weakness (generalized): Secondary | ICD-10-CM

## 2022-06-16 DIAGNOSIS — M25612 Stiffness of left shoulder, not elsewhere classified: Secondary | ICD-10-CM

## 2022-06-16 NOTE — Therapy (Signed)
OUTPATIENT PHYSICAL THERAPY TREATMENT NOTE   Patient Name: Casey Allen MRN: [id number] DOB:[birth year], 51 y.o., male Today's Date: 06/16/2022   PT End of Session - 06/16/22 0917     Visit Number 9    Number of Visits 24    Date for PT Re-Evaluation 07/08/22    PT Start Time 0847    PT Stop Time 0930    PT Time Calculation (min) 43 min    Activity Tolerance Patient tolerated treatment well    Behavior During Therapy [facility] for tasks assessed/performed                  Past Medical History:  Diagnosis Date   Allergic rhinitis    Allergy    Asthma    Cancer ([place])    skin cancer- left shoulder   GERD (gastroesophageal reflux disease)    Headache(784.0)    History of chickenpox    History of nephrolithiasis    Hypertension    Skin exam, screening for cancer    sees Dr. Annamary Carolin    Past Surgical History:  Procedure Laterality Date   COLONOSCOPY  12/06/2019   per Dr. 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 Corrie Mckusick and then had MOHS on 2/11/110 per DR. 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   Chronic cough 08/23/2021   Exercise-induced asthma 04/14/2018   HTN (hypertension) 06/09/2014   LIPOMA 09/18/2008   ALLERGIC RHINITIS 07/28/2008   GERD 07/28/2008   HEADACHE 07/28/2008   NEPHROLITHIASIS, HX OF 07/28/2008    PCP: Gertie Fey   REFERRING PROVIDER: Dr Ophelia Charter   REFERRING DIAG: Left Lysis of adhesions and biceps tenodesis   THERAPY DIAG:  Acute pain of left shoulder  Stiffness of left shoulder, not elsewhere classified  Muscle weakness (generalized)  Rationale for Evaluation and Treatment Rehabilitation  ONSET DATE: 05/11/2022  SUBJECTIVE:                                                                                                                                                                                       SUBJECTIVE STATEMENT: Pt is 5 weeks and s/p L shoulder biceps tenodesis and lysis of adhesions.  He went to the beach and held things while walking to the beach. He reports it was sore. He was also  reaching for something yesterday that made him sore. It is feeling better overall this morning.   PERTINENT HISTORY: Left shoulder skin cancer 2011; parkinson's    PAIN:  Are you having pain? Yes: NPRS scale:  1/10 pain currently though can be 3-4/10 if he moves it wrong.   Pain location: Distal biceps area  Pain description: acing  Aggravating factors: use of the shoulder  Relieving factors: rest and pain medication   PRECAUTIONS: Shoulder follow protocol   WEIGHT BEARING RESTRICTIONS Yes NWB follow protocol   FALLS:  Has patient fallen in last 6 months? No  LIVING ENVIRONMENT: Nothing pertinent  OCCUPATION: Works in IT  Hobbies: [employer]; Hitting a heavy bag  PLOF: Independent  PATIENT GOALS   Being able to use his left arm again   OBJECTIVE:   DIAGNOSTIC FINDINGS:  Nothing post op   PATIENT SURVEYS:  FOTO       TODAY'S TREATMENT:  8/14  Manual Therapy: Pt received Grade II/III AP/inf mobs to improve pain, ROM, and to normalize arthrokinematics f/b L Shoulder PROM in flexion, abd, ER, and IR per pt and tissue tolerance.   Supine flexion 2lbs 2x15  Supine scaption  2x10   Prone row to neutral 2x15 with 2lbs   Prone extension to neutral 2x10 with 2lbs     Prone horizontal abd to neutral 2x10   Supine ABC 2lbs 2x  SL ER 2x15      Last visit   L shoulder ROM: Flexion in supine:  AAROM:  144 deg, AROM:  138 deg ER AROM/PROM:  45/47  Therapeutic Exercise:   Reviewed current function, HEP compliance, response to prior Rx, and pain level. Assess L shoulder ROM.   Pt  performed:  Supine AAROM flexion 2x10 wand 2lb wand  Supine flexion AROM x10 reps  Supine serratus punch 2x10  Prone row to neutral 2x15 with YTB   Prone extension to neutral 2x10 with YTB     Prone horizontal abd to neutral 2x10     Manual Therapy: Pt received Grade II/III AP/inf mobs to improve pain, ROM, and to normalize arthrokinematics f/b L Shoulder PROM in flexion, abd, ER, and IR per pt and tissue tolerance.      PATIENT EDUCATION: Education details: reviewed HEP and symptom management; progression of activity  Person educated: Patient Education method: Explanation, Demonstration, Tactile cues, Verbal cues, and Handouts Education comprehension: verbalized understanding, returned demonstration, verbal cues required, tactile cues required, and needs further education   HOME EXERCISE PROGRAM: Access Code: [email or web address] URL: [email or web address] Date: 05/13/2022 Prepared by: Carolyne Littles  Exercises - Seated Shoulder Flexion Towel Slide at Table Top  - 3 x daily - 7 x weekly - 3 sets - 10 reps - Circular Shoulder Pendulum with Table Support  - 31 x daily - 7 x weekly - 3 sets - 10 reps - Supine Shoulder External Rotation in 45 Degrees Abduction AAROM with Dowel  - 3 x daily - 7 x weekly - 3 sets - 10 reps  ASSESSMENT:  CLINICAL IMPRESSION: Patient continues to make great progress. He had mild soreness at times with his exercises but overall tolerated well. His ER is nearing end range. His flexion was full but mildly limited . Therapy will continue to progress per protocol. Therapy advanced his weights slightly OBJECTIVE IMPAIRMENTS decreased activity tolerance, decreased ROM, decreased strength, impaired UE functional use, and pain.   ACTIVITY LIMITATIONS sleeping, dressing, and reach over head  PARTICIPATION LIMITATIONS: meal prep, cleaning, laundry, community  activity, occupation, and yard work  PERSONAL FACTORS None   REHAB POTENTIAL: Excellent  CLINICAL  DECISION MAKING: Stable/uncomplicated  EVALUATION COMPLEXITY: Low   GOALS: Goals reviewed with patient? Yes  SHORT TERM GOALS: Target date: 06/10/2022   Patient will increase passive shoulder ER to 45 degrees  Baseline: Goal status: INITIAL  2.  Patient will increase passive shoulder flexion to 130 degrees  Baseline:  Goal status: INITIAL  3.  Patient will be independent with base exercise program per protocol  Baseline:  Goal status: INITIAL  LONG TERM GOALS: Target date: 07/08/2022    Patient will be return to the gym without pain  Baseline:  Goal status: INITIAL  2.  Patient will demonstrate 80% of strength between left and right in order ot perfrom ADL's  Baseline:  Goal status: INITIAL  3.  Patient will use his arm for all functional tasks without pain  Baseline:  Goal status: INITIAL   PLAN: PT FREQUENCY: 3x/week 2nd to manip   PT DURATION: 6 weeks  PLANNED INTERVENTIONS: Therapeutic exercises, Therapeutic activity, Neuromuscular re-education, Balance training, Gait training, Patient/Family education, Joint mobilization, Aquatic Therapy, Dry Needling, Cryotherapy, Moist heat, Taping, Ultrasound, and Manual therapy  PLAN FOR NEXT SESSION:  PROM ->AAROM as tolerated, continue periscap strength. Follow Dr Wenda Low protocol tenodesis but also be aware he had a manip and lysis of adhesions.    Burnis Medin PT DPT  06/16/22 9:33 AM

## 2022-06-19 ENCOUNTER — Encounter (HOSPITAL_BASED_OUTPATIENT_CLINIC_OR_DEPARTMENT_OTHER): Payer: Self-pay | Admitting: Physical Therapy

## 2022-06-19 ENCOUNTER — Ambulatory Visit (HOSPITAL_BASED_OUTPATIENT_CLINIC_OR_DEPARTMENT_OTHER): Payer: No Typology Code available for payment source | Admitting: Physical Therapy

## 2022-06-19 DIAGNOSIS — M25512 Pain in left shoulder: Secondary | ICD-10-CM

## 2022-06-19 DIAGNOSIS — M25612 Stiffness of left shoulder, not elsewhere classified: Secondary | ICD-10-CM

## 2022-06-19 DIAGNOSIS — M6281 Muscle weakness (generalized): Secondary | ICD-10-CM

## 2022-06-19 NOTE — Therapy (Signed)
OUTPATIENT PHYSICAL THERAPY TREATMENT NOTE   Patient Name: Casey Allen MRN: [id number] DOB:February 18, 1971, 51 y.o., male Today's Date: 06/19/2022   PT End of Session - 06/19/22 0903     Visit Number 10    Number of Visits 24    Date for PT Re-Evaluation 07/08/22    PT Start Time 0845    PT Stop Time 0929    PT Time Calculation (min) 44 min    Activity Tolerance Patient tolerated treatment well    Behavior During Therapy [facility] for tasks assessed/performed                  Past Medical History:  Diagnosis Date   Allergic rhinitis    Allergy    Asthma    Cancer ([place])    skin cancer- left shoulder   GERD (gastroesophageal reflux disease)    Headache(784.0)    History of chickenpox    History of nephrolithiasis    Hypertension    Skin exam, screening for cancer    sees Dr. Annamary Carolin    Past Surgical History:  Procedure Laterality Date   COLONOSCOPY  12/06/2019   per Dr. 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 Corrie Mckusick and then had MOHS on 2/11/110 per DR. 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   Chronic cough 08/23/2021   Exercise-induced asthma 04/14/2018   HTN (hypertension) 06/09/2014   LIPOMA 09/18/2008   ALLERGIC RHINITIS 07/28/2008   GERD 07/28/2008   HEADACHE 07/28/2008   NEPHROLITHIASIS, HX OF 07/28/2008    PCP: Gertie Fey   REFERRING PROVIDER: Dr Ophelia Charter   REFERRING DIAG: Left Lysis of adhesions and biceps tenodesis   THERAPY DIAG:  Acute pain of left shoulder  Stiffness of left shoulder, not elsewhere classified  Muscle weakness (generalized)  Rationale for Evaluation and Treatment Rehabilitation  ONSET DATE: 05/11/2022  SUBJECTIVE:                                                                                                                                                                                       SUBJECTIVE STATEMENT: Pt is 5 weeks and s/p L shoulder biceps tenodesis and lysis of adhesions.  He went to the beach and held things while walking to the beach. He reports it was sore. He was also  reaching for something yesterday that made him sore. It is feeling better overall this morning.   PERTINENT HISTORY: Left shoulder skin cancer 2011; parkinson's    PAIN:  Are you having pain? Yes: NPRS scale:  1/10 pain currently though can be 3-4/10 if he moves it wrong.   Pain location: Distal biceps area  Pain description: acing  Aggravating factors: use of the shoulder  Relieving factors: rest and pain medication   PRECAUTIONS: Shoulder follow protocol   WEIGHT BEARING RESTRICTIONS Yes NWB follow protocol   FALLS:  Has patient fallen in last 6 months? No  LIVING ENVIRONMENT: Nothing pertinent  OCCUPATION: Works in [place]: [employer]; Hitting a heavy bag  PLOF: Independent  PATIENT GOALS   Being able to use his left arm again   OBJECTIVE:   DIAGNOSTIC FINDINGS:  Nothing post op   PATIENT SURVEYS:  FOTO       TODAY'S TREATMENT:  8/17 Manual Therapy: Pt received Grade II/III AP/inf mobs to improve pain, ROM, and to normalize arthrokinematics f/b L Shoulder PROM in flexion, abd, ER, and IR per pt and tissue tolerance.   Supine flexion 2lbs 2x15  Supine scaption  2x10   T-band IR /ER red x10   Standing flexion in mirror 2x10   Supine ABC 2lbs 1x 3lbs 1x   Supine flexion 2lbs x15 3 lbs x15   Attempted d2 active motion but had a popping feeling   8/14  Manual Therapy: Pt received Grade II/III AP/inf mobs to improve pain, ROM, and to normalize arthrokinematics f/b L Shoulder PROM in flexion, abd, ER, and IR per pt and tissue tolerance.   Supine flexion 2lbs 2x15  Supine  scaption  2x10   Prone row to neutral 2x15 with 2lbs   Prone extension to neutral 2x10 with 2lbs     Prone horizontal abd to neutral 2x10   Supine ABC 2lbs 2x  SL ER 2x15      Last visit   L shoulder ROM: Flexion in supine:  AAROM:  144 deg, AROM:  138 deg ER AROM/PROM:  45/47  Therapeutic Exercise:   Reviewed current function, HEP compliance, response to prior Rx, and pain level. Assess L shoulder ROM.   Pt performed:  Supine AAROM flexion 2x10 wand 2lb wand  Supine flexion AROM x10 reps  Supine serratus punch 2x10  Prone row to neutral 2x15 with YTB   Prone extension to neutral 2x10 with YTB     Prone horizontal abd to neutral 2x10     Manual Therapy: Pt received Grade II/III AP/inf mobs to improve pain, ROM, and to normalize arthrokinematics f/b L Shoulder PROM in flexion, abd, ER, and IR per pt and tissue tolerance.      PATIENT EDUCATION: Education details: reviewed HEP and symptom management; progression of activity  Person educated: Patient Education method: Explanation, Demonstration, Tactile cues, Verbal cues, and Handouts Education comprehension: verbalized understanding, returned demonstration, verbal cues required, tactile cues required, and needs further education   HOME EXERCISE PROGRAM: Access Code: [email or web address] URL: [email or web address] Date: 05/13/2022 Prepared by: Carolyne Littles  Exercises - Seated Shoulder Flexion Towel Slide at Table Top  - 3 x daily - 7 x weekly - 3 sets - 10 reps - Circular Shoulder Pendulum with Table Support  - 31 x daily - 7 x weekly - 3 sets - 10 reps - Supine Shoulder External Rotation in 45 Degrees Abduction AAROM with Dowel  - 3 x daily - 7 x weekly - 3  sets - 10 reps  ASSESSMENT:  CLINICAL IMPRESSION: We attempted a light d2 flexion today but the patient had a mild popping in his shoulder. It was decided to hold for now on that exercise. We advanced some of his other exercises to a 3lbs weight. We also  added in standing flexion. He is making good progress. We will continue to progress as tolerated. Range was approaching end range   OBJECTIVE IMPAIRMENTS decreased activity tolerance, decreased ROM, decreased strength, impaired UE functional use, and pain.   ACTIVITY LIMITATIONS sleeping, dressing, and reach over head  PARTICIPATION LIMITATIONS: meal prep, cleaning, laundry, community activity, occupation, and yard work  PERSONAL FACTORS None   REHAB POTENTIAL: Excellent  CLINICAL DECISION MAKING: Stable/uncomplicated  EVALUATION COMPLEXITY: Low   GOALS: Goals reviewed with patient? Yes  SHORT TERM GOALS: Target date: 06/10/2022   Patient will increase passive shoulder ER to 45 degrees  Baseline: Goal status: INITIAL  2.  Patient will increase passive shoulder flexion to 130 degrees  Baseline:  Goal status: INITIAL  3.  Patient will be independent with base exercise program per protocol  Baseline:  Goal status: INITIAL  LONG TERM GOALS: Target date: 07/08/2022    Patient will be return to the gym without pain  Baseline:  Goal status: INITIAL  2.  Patient will demonstrate 80% of strength between left and right in order ot perfrom ADL's  Baseline:  Goal status: INITIAL  3.  Patient will use his arm for all functional tasks without pain  Baseline:  Goal status: INITIAL   PLAN: PT FREQUENCY: 3x/week 2nd to manip   PT DURATION: 6 weeks  PLANNED INTERVENTIONS: Therapeutic exercises, Therapeutic activity, Neuromuscular re-education, Balance training, Gait training, Patient/Family education, Joint mobilization, Aquatic Therapy, Dry Needling, Cryotherapy, Moist heat, Taping, Ultrasound, and Manual therapy  PLAN FOR NEXT SESSION:  PROM ->AAROM as tolerated, continue periscap strength. Follow Dr Wenda Low protocol tenodesis but also be aware he had a manip and lysis of adhesions.    Burnis Medin PT DPT  06/19/22 11:42 AM

## 2022-06-23 ENCOUNTER — Ambulatory Visit (HOSPITAL_BASED_OUTPATIENT_CLINIC_OR_DEPARTMENT_OTHER): Payer: No Typology Code available for payment source | Admitting: Physical Therapy

## 2022-06-23 ENCOUNTER — Encounter (HOSPITAL_BASED_OUTPATIENT_CLINIC_OR_DEPARTMENT_OTHER): Payer: Self-pay | Admitting: Physical Therapy

## 2022-06-23 DIAGNOSIS — M25612 Stiffness of left shoulder, not elsewhere classified: Secondary | ICD-10-CM

## 2022-06-23 DIAGNOSIS — M25512 Pain in left shoulder: Secondary | ICD-10-CM

## 2022-06-23 DIAGNOSIS — M6281 Muscle weakness (generalized): Secondary | ICD-10-CM

## 2022-06-23 NOTE — Therapy (Addendum)
OUTPATIENT PHYSICAL THERAPY TREATMENT NOTE   Patient Name: Casey Allen MRN: [id number] DOB:1970/11/26, 51 y.o., male Today's Date: 06/23/2022   PT End of Session - 06/23/22 0913     Visit Number 11    Number of Visits 24    Date for PT Re-Evaluation 07/08/22    PT Start Time 0845    PT Stop Time 0923    PT Time Calculation (min) 38 min    Activity Tolerance Patient tolerated treatment well    Behavior During Therapy [facility] for tasks assessed/performed                   Past Medical History:  Diagnosis Date   Allergic rhinitis    Allergy    Asthma    Cancer ([place])    skin cancer- left shoulder   GERD (gastroesophageal reflux disease)    Headache(784.0)    History of chickenpox    History of nephrolithiasis    Hypertension    Skin exam, screening for cancer    sees Dr. Annamary Carolin    Past Surgical History:  Procedure Laterality Date   COLONOSCOPY  12/06/2019   per Dr. 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 Corrie Mckusick and then had MOHS on 2/11/110 per DR. 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   Chronic cough 08/23/2021   Exercise-induced asthma 04/14/2018   HTN (hypertension) 06/09/2014   LIPOMA 09/18/2008   ALLERGIC RHINITIS 07/28/2008   GERD 07/28/2008   HEADACHE 07/28/2008   NEPHROLITHIASIS, HX OF 07/28/2008    PCP: Gertie Fey   REFERRING PROVIDER: Dr Ophelia Charter   REFERRING DIAG: Left Lysis of adhesions and biceps tenodesis   THERAPY DIAG:  Acute pain of left shoulder  Stiffness of left shoulder, not elsewhere classified  Muscle weakness (generalized)  Rationale for Evaluation and Treatment Rehabilitation  ONSET DATE: 05/11/2022  SUBJECTIVE:                                                                                                                                                                                       SUBJECTIVE STATEMENT: Pt is 5 weeks and s/p L shoulder biceps tenodesis and lysis of adhesions.  He went to the beach and held things while walking to the beach. He reports it was sore. He was  also reaching for something yesterday that made him sore. It is feeling better overall this morning.   PERTINENT HISTORY: Left shoulder skin cancer 2011; parkinson's    PAIN:  Are you having pain? Yes: NPRS scale:  1/10 pain currently though can be 3-4/10 if he moves it wrong.   Pain location: Distal biceps area  Pain description: acing  Aggravating factors: use of the shoulder  Relieving factors: rest and pain medication   PRECAUTIONS: Shoulder follow protocol   WEIGHT BEARING RESTRICTIONS Yes NWB follow protocol   FALLS:  Has patient fallen in last 6 months? No  LIVING ENVIRONMENT: Nothing pertinent  OCCUPATION: Works in [employer]: [employer]; Hitting a heavy bag  PLOF: Independent  PATIENT GOALS   Being able to use his left arm again   OBJECTIVE:   DIAGNOSTIC FINDINGS:  Nothing post op   PATIENT SURVEYS:  FOTO       TODAY'S TREATMENT:  8/21 Manual Therapy: Pt received Grade II/III AP/inf mobs to improve pain, ROM, and to normalize arthrokinematics f/b L Shoulder PROM in flexion, abd, ER, and IR per pt and tissue tolerance.  Supine ABC 3lb 2x1 Standing flexion 2x10 1 lbs  Standing Y 2x10 1lbs   Row red x20  Shoulder extension x20 red   Ball v wall cw ccw flex side x15   Supine flexion 3lbs   SL ER 3lbs              8/17 Manual Therapy: Pt received Grade II/III AP/inf mobs to improve pain, ROM, and to normalize arthrokinematics f/b L Shoulder PROM in flexion, abd, ER, and IR per pt and tissue tolerance.   Supine flexion 2lbs 2x15  Supine scaption  2x10   T-band  IR /ER red x10   Standing flexion in mirror 2x10   Supine ABC 2lbs 1x 3lbs 1x   Supine flexion 2lbs x15 3 lbs x15   Attempted d2 active motion but had a popping feeling   8/14  Manual Therapy: Pt received Grade II/III AP/inf mobs to improve pain, ROM, and to normalize arthrokinematics f/b L Shoulder PROM in flexion, abd, ER, and IR per pt and tissue tolerance.   Supine flexion 2lbs 2x15  Supine scaption  2x10   Prone row to neutral 2x15 with 2lbs   Prone extension to neutral 2x10 with 2lbs     Prone horizontal abd to neutral 2x10   Supine ABC 2lbs 2x  SL ER 2x15      Last visit   L shoulder ROM: Flexion in supine:  AAROM:  144 deg, AROM:  138 deg ER AROM/PROM:  45/47  Therapeutic Exercise:   Reviewed current function, HEP compliance, response to prior Rx, and pain level. Assess L shoulder ROM.   Pt performed:  Supine AAROM flexion 2x10 wand 2lb wand  Supine flexion AROM x10 reps  Supine serratus punch 2x10  Prone row to neutral 2x15 with YTB   Prone extension to neutral 2x10 with YTB     Prone horizontal abd to neutral 2x10     Manual Therapy: Pt received Grade II/III AP/inf mobs to improve pain, ROM, and to normalize arthrokinematics f/b L Shoulder PROM in flexion, abd, ER, and IR per pt and tissue tolerance.      PATIENT EDUCATION: Education details: reviewed HEP and symptom management; progression of activity  Person educated: Patient Education method: Explanation, Demonstration, Tactile cues, Verbal cues, and Handouts Education comprehension: verbalized understanding, returned demonstration, verbal cues required, tactile cues required,  and needs further education   HOME EXERCISE PROGRAM: Access Code: [email or web address] URL: [email or web address] Date: 05/13/2022 Prepared by: Carolyne Littles  Exercises - Seated Shoulder Flexion Towel Slide at Table Top  - 3 x daily - 7 x weekly - 3 sets - 10 reps - Circular Shoulder Pendulum with Table Support  -  31 x daily - 7 x weekly - 3 sets - 10 reps - Supine Shoulder External Rotation in 45 Degrees Abduction AAROM with Dowel  - 3 x daily - 7 x weekly - 3 sets - 10 reps  ASSESSMENT:  CLINICAL IMPRESSION: The patient had reported that the biggest functional issue he is having right now is reaching behind his back. At this point he is 6 weeks post-op We began with light wand IR with care not to over extend the arm behind the back. He continues to tolerate strengthening well. He had minor end range ER limitations with PROM. We also added endurance exercises today. We will continue to progress as tolerated.  OBJECTIVE IMPAIRMENTS decreased activity tolerance, decreased ROM, decreased strength, impaired UE functional use, and pain.   ACTIVITY LIMITATIONS sleeping, dressing, and reach over head  PARTICIPATION LIMITATIONS: meal prep, cleaning, laundry, community activity, occupation, and yard work  PERSONAL FACTORS None   REHAB POTENTIAL: Excellent  CLINICAL DECISION MAKING: Stable/uncomplicated  EVALUATION COMPLEXITY: Low   GOALS: Goals reviewed with patient? Yes  SHORT TERM GOALS: Target date: 06/10/2022   Patient will increase passive shoulder ER to 45 degrees  Baseline: Goal status: INITIAL  2.  Patient will increase passive shoulder flexion to 130 degrees  Baseline:  Goal status: INITIAL  3.  Patient will be independent with base exercise program per protocol  Baseline:  Goal status: INITIAL  LONG TERM GOALS: Target date: 07/08/2022    Patient will be return to the gym without pain  Baseline:  Goal status: INITIAL  2.  Patient will demonstrate 80% of strength between left and right in order ot perfrom ADL's  Baseline:  Goal status: INITIAL  3.  Patient will use his arm for all functional tasks without pain  Baseline:  Goal status: INITIAL   PLAN: PT FREQUENCY: 3x/week 2nd to manip   PT DURATION: 6 weeks  PLANNED INTERVENTIONS: Therapeutic exercises, Therapeutic  activity, Neuromuscular re-education, Balance training, Gait training, Patient/Family education, Joint mobilization, Aquatic Therapy, Dry Needling, Cryotherapy, Moist heat, Taping, Ultrasound, and Manual therapy  PLAN FOR NEXT SESSION:  PROM ->AAROM as tolerated, continue periscap strength. Follow Dr Wenda Low protocol tenodesis but also be aware he had a manip and lysis of adhesions.    Burnis Medin PT DPT  06/23/22 10:06 AM

## 2022-06-26 ENCOUNTER — Encounter (HOSPITAL_BASED_OUTPATIENT_CLINIC_OR_DEPARTMENT_OTHER): Payer: Self-pay | Admitting: Physical Therapy

## 2022-06-26 ENCOUNTER — Ambulatory Visit (HOSPITAL_BASED_OUTPATIENT_CLINIC_OR_DEPARTMENT_OTHER): Payer: No Typology Code available for payment source | Admitting: Physical Therapy

## 2022-06-26 DIAGNOSIS — M25512 Pain in left shoulder: Secondary | ICD-10-CM

## 2022-06-26 DIAGNOSIS — M6281 Muscle weakness (generalized): Secondary | ICD-10-CM

## 2022-06-26 DIAGNOSIS — M25612 Stiffness of left shoulder, not elsewhere classified: Secondary | ICD-10-CM

## 2022-06-26 NOTE — Therapy (Signed)
OUTPATIENT PHYSICAL THERAPY TREATMENT NOTE   Patient Name: Casey Allen MRN: [id number] DOB:1971-04-18, 51 y.o., male Today's Date: 06/26/2022   PT End of Session - 06/26/22 0953     Visit Number 12    Number of Visits 24    Date for PT Re-Evaluation 07/08/22    PT Start Time 0849   therapy late   PT Stop Time 0929    PT Time Calculation (min) 40 min    Activity Tolerance Patient tolerated treatment well    Behavior During Therapy [facility] for tasks assessed/performed                   Past Medical History:  Diagnosis Date   Allergic rhinitis    Allergy    Asthma    Cancer ([place])    skin cancer- left shoulder   GERD (gastroesophageal reflux disease)    Headache(784.0)    History of chickenpox    History of nephrolithiasis    Hypertension    Skin exam, screening for cancer    sees Dr. Annamary Carolin    Past Surgical History:  Procedure Laterality Date   COLONOSCOPY  12/06/2019   per Dr. 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 Corrie Mckusick and then had MOHS on 2/11/110 per DR. 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   Chronic cough 08/23/2021   Exercise-induced asthma 04/14/2018   HTN (hypertension) 06/09/2014   LIPOMA 09/18/2008   ALLERGIC RHINITIS 07/28/2008   GERD 07/28/2008   HEADACHE 07/28/2008   NEPHROLITHIASIS, HX OF 07/28/2008    PCP: Gertie Fey   REFERRING PROVIDER: Dr Ophelia Charter   REFERRING DIAG: Left Lysis of adhesions and biceps tenodesis   THERAPY DIAG:  Acute pain of left shoulder  Stiffness of left shoulder, not elsewhere classified  Muscle weakness (generalized)  Rationale for Evaluation and Treatment Rehabilitation  ONSET DATE:  05/11/2022  SUBJECTIVE:                                                                                                                                                                                      SUBJECTIVE STATEMENT: Pt is 5 weeks and s/p L shoulder biceps tenodesis and lysis of adhesions.    The patient is a little sore after the last visit.  PERTINENT HISTORY: Left shoulder  skin cancer 2011; parkinson's    PAIN:  Are you having pain? Yes: NPRS scale:  3/10 pain currently  8/24 Pain location: Distal biceps area  Pain description: acing  Aggravating factors: use of the shoulder  Relieving factors: rest and pain medication   PRECAUTIONS: Shoulder follow protocol   WEIGHT BEARING RESTRICTIONS Yes NWB follow protocol   FALLS:  Has patient fallen in last 6 months? 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  Supine ABC 3lb 2x1  Supine flexion 2lbs 2x15  Supine scaption  2x10 2lb   Towel stretch 10x 5 sec hold   Prone row x20 2lbs Prone extension 2lbs x20   Side lying ER x20 2lbs        8/21 Manual Therapy: Pt received Grade II/III AP/inf mobs to improve pain, ROM, and to normalize arthrokinematics f/b L Shoulder PROM in flexion, abd, ER, and IR per pt and tissue tolerance.  Supine ABC 3lb 2x1 Standing flexion 2x10 1 lbs  Standing Y 2x10 1lbs   Row red x20  Shoulder extension x20 red   Ball v wall cw ccw flex side x15   Supine flexion 3lbs   SL ER 3lbs                8/17 Manual  Therapy: Pt received Grade II/III AP/inf mobs to improve pain, ROM, and to normalize arthrokinematics f/b L Shoulder PROM in flexion, abd, ER, and IR per pt and tissue tolerance.   Supine flexion 2lbs 2x15  Supine scaption  2x10   T-band IR /ER red x10   Standing flexion in mirror 2x10   Supine ABC 2lbs 1x 3lbs 1x   Supine flexion 2lbs x15 3 lbs x15   Attempted d2 active motion but had a popping feeling   8/14  Manual Therapy: Pt received Grade II/III AP/inf mobs to improve pain, ROM, and to normalize arthrokinematics f/b L Shoulder PROM in flexion, abd, ER, and IR per pt and tissue tolerance.   Supine flexion 2lbs 2x15  Supine scaption  2x10   Prone row to neutral 2x15 with 2lbs   Prone extension to neutral 2x10 with 2lbs     Prone horizontal abd to neutral 2x10   Supine ABC 2lbs 2x  SL ER 2x15      Last visit   L shoulder ROM: Flexion in supine:  AAROM:  144 deg, AROM:  138 deg ER AROM/PROM:  45/47  Therapeutic Exercise:   Reviewed current function, HEP compliance, response to prior Rx, and pain level. Assess L shoulder ROM.   Pt performed:  Supine AAROM flexion 2x10 wand 2lb wand  Supine flexion AROM x10 reps  Supine serratus punch 2x10  Prone row to neutral 2x15 with YTB   Prone extension to neutral 2x10 with YTB     Prone horizontal abd to neutral 2x10     Manual Therapy: Pt received Grade II/III AP/inf mobs to improve pain, ROM, and to normalize arthrokinematics f/b L Shoulder PROM in flexion, abd,  ER, and IR per pt and tissue tolerance.      PATIENT EDUCATION: Education details: reviewed HEP and symptom management; progression of activity  Person educated: Patient Education method: Explanation, Demonstration, Tactile cues, Verbal cues, and Handouts Education comprehension: verbalized understanding, returned demonstration, verbal cues required, tactile cues required, and needs further education   HOME EXERCISE PROGRAM: Access Code: [email or web address] URL:  [email or web address] Date: 05/13/2022 Prepared by: Carolyne Littles  Exercises - Seated Shoulder Flexion Towel Slide at Table Top  - 3 x daily - 7 x weekly - 3 sets - 10 reps - Circular Shoulder Pendulum with Table Support  - 31 x daily - 7 x weekly - 3 sets - 10 reps - Supine Shoulder External Rotation in 45 Degrees Abduction AAROM with Dowel  - 3 x daily - 7 x weekly - 3 sets - 10 reps  ASSESSMENT:  CLINICAL IMPRESSION: The patient was a little more sore today, so we focused more on manual stretching. He was a little tight and sore with end range ER but improved after treatment. He continues to make good progress. We will ramo his exercises back up if he responds well today. He is back to bike riding.   OBJECTIVE IMPAIRMENTS decreased activity tolerance, decreased ROM, decreased strength, impaired UE functional use, and pain.   ACTIVITY LIMITATIONS sleeping, dressing, and reach over head  PARTICIPATION LIMITATIONS: meal prep, cleaning, laundry, community activity, occupation, and yard work  PERSONAL FACTORS None   REHAB POTENTIAL: Excellent  CLINICAL DECISION MAKING: Stable/uncomplicated  EVALUATION COMPLEXITY: Low   GOALS: Goals reviewed with patient? Yes  SHORT TERM GOALS: Target date: 06/10/2022   Patient will increase passive shoulder ER to 45 degrees  Baseline: Goal status: INITIAL  2.  Patient will increase passive shoulder flexion to 130 degrees  Baseline:  Goal status: INITIAL  3.  Patient will be independent with base exercise program per protocol  Baseline:  Goal status: INITIAL  LONG TERM GOALS: Target date: 07/08/2022    Patient will be return to the gym without pain  Baseline:  Goal status: INITIAL  2.  Patient will demonstrate 80% of strength between left and right in order ot perfrom ADL's  Baseline:  Goal status: INITIAL  3.  Patient will use his arm for all functional tasks without pain  Baseline:  Goal status: INITIAL   PLAN: PT  FREQUENCY: 3x/week 2nd to manip   PT DURATION: 6 weeks  PLANNED INTERVENTIONS: Therapeutic exercises, Therapeutic activity, Neuromuscular re-education, Balance training, Gait training, Patient/Family education, Joint mobilization, Aquatic Therapy, Dry Needling, Cryotherapy, Moist heat, Taping, Ultrasound, and Manual therapy  PLAN FOR NEXT SESSION:  PROM ->AAROM as tolerated, continue periscap strength. Follow Dr Wenda Low protocol tenodesis but also be aware he had a manip and lysis of adhesions.    Burnis Medin PT DPT  06/26/22 9:56 AM

## 2022-07-04 ENCOUNTER — Encounter (HOSPITAL_BASED_OUTPATIENT_CLINIC_OR_DEPARTMENT_OTHER): Payer: Self-pay | Admitting: Physical Therapy

## 2022-07-04 ENCOUNTER — Ambulatory Visit (HOSPITAL_BASED_OUTPATIENT_CLINIC_OR_DEPARTMENT_OTHER): Payer: No Typology Code available for payment source | Attending: Orthopaedic Surgery | Admitting: Physical Therapy

## 2022-07-04 DIAGNOSIS — M25612 Stiffness of left shoulder, not elsewhere classified: Secondary | ICD-10-CM | POA: Insufficient documentation

## 2022-07-04 DIAGNOSIS — M25512 Pain in left shoulder: Secondary | ICD-10-CM | POA: Insufficient documentation

## 2022-07-04 DIAGNOSIS — M6281 Muscle weakness (generalized): Secondary | ICD-10-CM | POA: Diagnosis present

## 2022-07-04 NOTE — Therapy (Signed)
OUTPATIENT PHYSICAL THERAPY TREATMENT NOTE   Patient Name: Casey Allen MRN: [id number] DOB:1970-12-20, 51 y.o., male Today's Date: 07/04/2022   PT End of Session - 07/04/22 1306     Visit Number 13    Number of Visits 24    Date for PT Re-Evaluation 07/08/22    PT Start Time 1300    PT Stop Time 1343    PT Time Calculation (min) 43 min    Activity Tolerance Patient tolerated treatment well    Behavior During Therapy WFL for tasks assessed/performed                   Past Medical History:  Diagnosis Date   Allergic rhinitis    Allergy    Asthma    Cancer ([place])    skin cancer- left shoulder   GERD (gastroesophageal reflux disease)    Headache(784.0)    History of chickenpox    History of nephrolithiasis    Hypertension    Skin exam, screening for cancer    sees Dr. Annamary Carolin    Past Surgical History:  Procedure Laterality Date   COLONOSCOPY  12/06/2019   per Dr. 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 Corrie Mckusick and then had MOHS on 2/11/110 per DR. 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   Chronic cough 08/23/2021   Exercise-induced asthma 04/14/2018   HTN (hypertension) 06/09/2014   LIPOMA 09/18/2008   ALLERGIC RHINITIS 07/28/2008   GERD 07/28/2008   HEADACHE 07/28/2008   NEPHROLITHIASIS, HX OF 07/28/2008    PCP: Gertie Fey   REFERRING PROVIDER: Dr Ophelia Charter   REFERRING DIAG: Left Lysis of adhesions and biceps tenodesis   THERAPY DIAG:  Acute pain of left shoulder  Stiffness of left shoulder, not elsewhere classified  Muscle weakness (generalized)  Rationale for Evaluation and Treatment Rehabilitation  ONSET DATE: 05/11/2022  SUBJECTIVE:                                                                                                                                                                                       SUBJECTIVE STATEMENT: Pt is 5 weeks and s/p L shoulder biceps tenodesis and lysis of adhesions.  He is doing well. He has been to the MD who is happy with his progression.   The patient  is a little sore after the last visit.  PERTINENT HISTORY: Left shoulder skin cancer 2011; parkinson's    PAIN:  Are you having pain? Yes: NPRS scale:  3/10 pain currently  8/24 Pain location: Distal biceps area  Pain description: acing  Aggravating factors: use of the shoulder  Relieving factors: rest and pain medication   PRECAUTIONS: Shoulder follow protocol   WEIGHT BEARING RESTRICTIONS Yes NWB follow protocol   FALLS:  Has patient fallen in last 6 months? 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[employer]; Hitting a heavy bag  PLOF: Independent  PATIENT GOALS   Being able to use his left arm again   OBJECTIVE:   DIAGNOSTIC FINDINGS:  Nothing post op   PATIENT SURVEYS:  FOTO       TODAY'S TREATMENT:  8/24 Supine ABC 4lb 2x1 SLER 3lbs x20  Supine flexion x20 3lbs   Standing flexion and Y x15 each 2lbs   Row 2x15 15 lbs  Extension 2x15 15 lbs cables   Doorway pec stretch 3x20 sec hold with cuing for intensity of stretch   Pt received Grade II/III AP/inf mobs to improve pain, ROM, and to normalize arthrokinematics f/b L Shoulder PROM in flexion, abd, ER, and IR per pt and tissue tolerance.Pec release with active stretch of abduction.   8/24 Manual Therapy: Pt received Grade II/III AP/inf mobs to improve pain, ROM, and to normalize arthrokinematics f/b L Shoulder PROM in flexion, abd, ER, and IR per pt and tissue tolerance.  Supine ABC 3lb 2x1  Supine flexion 2lbs 2x15  Supine scaption  2x10 2lb   Towel stretch 10x 5 sec hold   Prone row x20  2lbs Prone extension 2lbs x20   Side lying ER x20 2lbs        8/21 Manual Therapy: Pt received Grade II/III AP/inf mobs to improve pain, ROM, and to normalize arthrokinematics f/b L Shoulder PROM in flexion, abd, ER, and IR per pt and tissue tolerance.  Supine ABC 3lb 2x1 Standing flexion 2x10 1 lbs  Standing Y 2x10 1lbs   Row red x20  Shoulder extension x20 red   Ball v wall cw ccw flex side x15   Supine flexion 3lbs   SL ER 3lbs           PATIENT EDUCATION: Education details: reviewed HEP and symptom management; progression of activity  Person educated: Patient Education method: Explanation, Demonstration, Tactile cues, Verbal cues, and Handouts Education comprehension: verbalized understanding, returned demonstration, verbal cues required, tactile cues required, and needs further education   HOME EXERCISE PROGRAM: Access Code: [email or web address] URL: [email or web address] Date: 05/13/2022 Prepared by: Carolyne Littles  Exercises - Seated Shoulder Flexion Towel Slide at Table Top  - 3 x daily - 7 x weekly - 3 sets - 10 reps - Circular Shoulder Pendulum with Table Support  - 31 x daily - 7 x weekly - 3 sets - 10 reps - Supine Shoulder External Rotation in 45 Degrees Abduction AAROM with Dowel  - 3 x daily - 7 x weekly - 3 sets - 10 reps  ASSESSMENT:  CLINICAL IMPRESSION: Therapy focused on shoulder abduction stretching with trigger point release to the pec. That movement increased significantly with manual therapy. We moved some of his weights along. Overall he is making good progress. He was given a pec stretch for home.   OBJECTIVE IMPAIRMENTS decreased activity tolerance, decreased ROM, decreased strength, impaired UE functional use, and pain.   ACTIVITY LIMITATIONS  sleeping, dressing, and reach over head  PARTICIPATION LIMITATIONS: meal prep, cleaning, laundry, community activity, occupation, and yard work  PERSONAL FACTORS None   REHAB  POTENTIAL: Excellent  CLINICAL DECISION MAKING: Stable/uncomplicated  EVALUATION COMPLEXITY: Low   GOALS: Goals reviewed with patient? Yes  SHORT TERM GOALS: Target date: 06/10/2022   Patient will increase passive shoulder ER to 45 degrees  Baseline: Goal status: INITIAL  2.  Patient will increase passive shoulder flexion to 130 degrees  Baseline:  Goal status: INITIAL  3.  Patient will be independent with base exercise program per protocol  Baseline:  Goal status: INITIAL  LONG TERM GOALS: Target date: 07/08/2022    Patient will be return to the gym without pain  Baseline:  Goal status: INITIAL  2.  Patient will demonstrate 80% of strength between left and right in order ot perfrom ADL's  Baseline:  Goal status: INITIAL  3.  Patient will use his arm for all functional tasks without pain  Baseline:  Goal status: INITIAL   PLAN: PT FREQUENCY: 3x/week 2nd to manip   PT DURATION: 6 weeks  PLANNED INTERVENTIONS: Therapeutic exercises, Therapeutic activity, Neuromuscular re-education, Balance training, Gait training, Patient/Family education, Joint mobilization, Aquatic Therapy, Dry Needling, Cryotherapy, Moist heat, Taping, Ultrasound, and Manual therapy  PLAN FOR NEXT SESSION:  PROM ->AAROM as tolerated, continue periscap strength. Follow Dr Wenda Low protocol tenodesis but also be aware he had a manip and lysis of adhesions.    Burnis Medin PT DPT  07/04/22 1:23 PM

## 2022-07-08 ENCOUNTER — Encounter (HOSPITAL_BASED_OUTPATIENT_CLINIC_OR_DEPARTMENT_OTHER): Payer: Self-pay | Admitting: Physical Therapy

## 2022-07-08 ENCOUNTER — Ambulatory Visit (HOSPITAL_BASED_OUTPATIENT_CLINIC_OR_DEPARTMENT_OTHER): Payer: No Typology Code available for payment source | Admitting: Physical Therapy

## 2022-07-08 DIAGNOSIS — M25612 Stiffness of left shoulder, not elsewhere classified: Secondary | ICD-10-CM

## 2022-07-08 DIAGNOSIS — M25512 Pain in left shoulder: Secondary | ICD-10-CM | POA: Diagnosis not present

## 2022-07-08 DIAGNOSIS — M6281 Muscle weakness (generalized): Secondary | ICD-10-CM

## 2022-07-08 NOTE — Therapy (Signed)
OUTPATIENT PHYSICAL THERAPY TREATMENT NOTE   Patient Name: Casey Allen MRN: [id number] DOB:Jul 09, 1971, 51 y.o., male Today's Date: 07/08/2022   PT End of Session - 07/08/22 0853     Visit Number 14    Number of Visits 24    Date for PT Re-Evaluation 07/08/22    PT Start Time 0848    PT Stop Time 0930    PT Time Calculation (min) 42 min    Activity Tolerance Patient tolerated treatment well    Behavior During Therapy [facility] for tasks assessed/performed                   Past Medical History:  Diagnosis Date   Allergic rhinitis    Allergy    Asthma    Cancer ([place])    skin cancer- left shoulder   GERD (gastroesophageal reflux disease)    Headache(784.0)    History of chickenpox    History of nephrolithiasis    Hypertension    Skin exam, screening for cancer    sees Dr. Annamary Carolin    Past Surgical History:  Procedure Laterality Date   COLONOSCOPY  12/06/2019   per Dr. 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 Corrie Mckusick and then had MOHS on 2/11/110 per DR. 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   Chronic cough 08/23/2021   Exercise-induced asthma 04/14/2018   HTN (hypertension) 06/09/2014   LIPOMA 09/18/2008   ALLERGIC RHINITIS 07/28/2008   GERD 07/28/2008   HEADACHE 07/28/2008   NEPHROLITHIASIS, HX OF 07/28/2008    PCP: Gertie Fey   REFERRING PROVIDER: Dr Ophelia Charter   REFERRING DIAG: Left Lysis of adhesions and biceps tenodesis   THERAPY DIAG:  Acute pain of left shoulder  Stiffness of left shoulder, not elsewhere classified  Muscle weakness (generalized)  Rationale for Evaluation and Treatment Rehabilitation  ONSET DATE: 05/11/2022  SUBJECTIVE:                                                                                                                                                                                       SUBJECTIVE STATEMENT: Pt is 5 weeks and s/p L shoulder biceps tenodesis and lysis of adhesions.  He is doing well. He has been to the MD who is happy with his progression.   The patient  is a little sore after the last visit.  PERTINENT HISTORY: Left shoulder skin cancer 2011; parkinson's    PAIN:  Are you having pain? Yes: NPRS scale:  3/10 pain currently  8/24 Pain location: Distal biceps area  Pain description: acing  Aggravating factors: use of the shoulder  Relieving factors: rest and pain medication   PRECAUTIONS: Shoulder follow protocol   WEIGHT BEARING RESTRICTIONS Yes NWB follow protocol   FALLS:  Has patient fallen in last 6 months? 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   8/24 Supine ABC 4lb 2x1 SLER 3lbs x20  Supine flexion x20 3lbs   Standing flexion and Y x15 each 2lbs   Row 2x15 15 lbs  Extension 2x15 15 lbs cables   Doorway pec stretch 3x20 sec hold with cuing for intensity of stretch   Pt received Grade II/III AP/inf mobs to improve pain, ROM, and to normalize arthrokinematics f/b L Shoulder PROM in flexion, abd, ER, and IR per pt and tissue  tolerance.Pec release with active stretch of abduction.   8/24 Manual Therapy: Pt received Grade II/III AP/inf mobs to improve pain, ROM, and to normalize arthrokinematics f/b L Shoulder PROM in flexion, abd, ER, and IR per pt and tissue tolerance.  Supine ABC 3lb 2x1  Supine flexion 2lbs 2x15  Supine scaption  2x10 2lb   Towel stretch 10x 5 sec hold   Prone row x20 2lbs Prone extension 2lbs x20   Side lying ER x20 2lbs        8/21 Manual Therapy: Pt received Grade II/III AP/inf mobs to improve pain, ROM, and to normalize arthrokinematics f/b L Shoulder PROM in flexion, abd, ER, and IR per pt and tissue tolerance.  Supine ABC 3lb 2x1 Standing flexion 2x10 1 lbs  Standing Y 2x10 1lbs   Row red x20  Shoulder extension x20 red   Ball v wall cw ccw flex side x15   Supine flexion 3lbs   SL ER 3lbs           PATIENT EDUCATION: Education details: reviewed HEP and symptom management; progression of activity  Person educated: Patient Education method: Explanation, Demonstration, Tactile cues, Verbal cues, and Handouts Education comprehension: verbalized understanding, returned demonstration, verbal cues required, tactile cues required, and needs further education   HOME EXERCISE PROGRAM: Access Code: [email or web address] URL: [email or web address] Date: 05/13/2022 Prepared by: Carolyne Littles  Exercises - Seated Shoulder Flexion Towel Slide at Table Top  - 3 x daily - 7 x weekly - 3 sets - 10 reps - Circular Shoulder Pendulum with Table Support  - 31 x daily - 7 x weekly - 3 sets - 10 reps - Supine Shoulder External Rotation in 45 Degrees Abduction AAROM with Dowel  - 3 x daily - 7 x weekly -  3 sets - 10 reps  ASSESSMENT:  CLINICAL IMPRESSION: Patients abduction improved significantly from last visit. He has less tension in his pec. We advanced his weights beck up. He could feel it but it was tolerable. Therapy will continue to advance patient as tolerated.    OBJECTIVE IMPAIRMENTS decreased activity tolerance, decreased ROM, decreased strength, impaired UE functional use, and pain.   ACTIVITY LIMITATIONS sleeping, dressing, and reach over head  PARTICIPATION LIMITATIONS: meal prep, cleaning, laundry, community activity, occupation, and yard work  PERSONAL FACTORS None   REHAB POTENTIAL: Excellent  CLINICAL DECISION MAKING: Stable/uncomplicated  EVALUATION COMPLEXITY: Low   GOALS: Goals reviewed with patient? Yes  SHORT TERM GOALS: Target date: 06/10/2022   Patient will increase passive shoulder ER to 45 degrees  Baseline: Goal status: INITIAL  2.  Patient will increase passive shoulder flexion to 130 degrees  Baseline:  Goal status: INITIAL  3.  Patient will be independent with base exercise program per protocol  Baseline:  Goal status: INITIAL  LONG TERM GOALS: Target date: 07/08/2022    Patient will be return to the gym without pain  Baseline:  Goal status: INITIAL  2.  Patient will demonstrate 80% of strength between left and right in order ot perfrom ADL's  Baseline:  Goal status: INITIAL  3.  Patient will use his arm for all functional tasks without pain  Baseline:  Goal status: INITIAL   PLAN: PT FREQUENCY: 3x/week 2nd to manip   PT DURATION: 6 weeks  PLANNED INTERVENTIONS: Therapeutic exercises, Therapeutic activity, Neuromuscular re-education, Balance training, Gait training, Patient/Family education, Joint mobilization, Aquatic Therapy, Dry Needling, Cryotherapy, Moist heat, Taping, Ultrasound, and Manual therapy  PLAN FOR NEXT SESSION:  PROM ->AAROM as tolerated, continue periscap strength. Follow Dr Wenda Low protocol tenodesis but also be aware he had a manip and lysis of adhesions.    Burnis Medin PT DPT  07/08/22 9:04 AM

## 2022-07-15 ENCOUNTER — Ambulatory Visit (HOSPITAL_BASED_OUTPATIENT_CLINIC_OR_DEPARTMENT_OTHER): Payer: No Typology Code available for payment source | Admitting: Physical Therapy

## 2022-07-15 ENCOUNTER — Encounter (HOSPITAL_BASED_OUTPATIENT_CLINIC_OR_DEPARTMENT_OTHER): Payer: Self-pay | Admitting: Physical Therapy

## 2022-07-15 DIAGNOSIS — M25512 Pain in left shoulder: Secondary | ICD-10-CM | POA: Diagnosis not present

## 2022-07-15 DIAGNOSIS — M6281 Muscle weakness (generalized): Secondary | ICD-10-CM

## 2022-07-15 DIAGNOSIS — M25612 Stiffness of left shoulder, not elsewhere classified: Secondary | ICD-10-CM

## 2022-07-15 NOTE — Therapy (Signed)
OUTPATIENT PHYSICAL THERAPY TREATMENT NOTE   Patient Name: Casey Allen MRN: [id number] DOB:1971/03/03, 51 y.o., male Today's Date: 07/15/2022   PT End of Session - 07/15/22 0906     Visit Number 15    Number of Visits 24    Date for PT Re-Evaluation 07/08/22    PT Start Time 0847    PT Stop Time 0930    PT Time Calculation (min) 43 min    Activity Tolerance Patient tolerated treatment well    Behavior During Therapy [facility] for tasks assessed/performed                   Past Medical History:  Diagnosis Date   Allergic rhinitis    Allergy    Asthma    Cancer ([place])    skin cancer- left shoulder   GERD (gastroesophageal reflux disease)    Headache(784.0)    History of chickenpox    History of nephrolithiasis    Hypertension    Skin exam, screening for cancer    sees Dr. Annamary Carolin    Past Surgical History:  Procedure Laterality Date   COLONOSCOPY  12/06/2019   per Dr. 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 Corrie Mckusick and then had MOHS on 2/11/110 per DR. 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   Chronic cough 08/23/2021   Exercise-induced asthma 04/14/2018   HTN (hypertension) 06/09/2014   LIPOMA 09/18/2008   ALLERGIC RHINITIS 07/28/2008   GERD 07/28/2008   HEADACHE 07/28/2008   NEPHROLITHIASIS, HX OF 07/28/2008    PCP: Gertie Fey   REFERRING PROVIDER: Dr Ophelia Charter   REFERRING DIAG: Left Lysis of adhesions and biceps tenodesis   THERAPY DIAG:  Acute pain of left shoulder  Stiffness of left shoulder, not elsewhere classified  Muscle weakness (generalized)  Rationale for Evaluation and Treatment Rehabilitation  ONSET DATE: 05/11/2022  SUBJECTIVE:                                                                                                                                                                                       SUBJECTIVE STATEMENT: Pt is 5 weeks and s/p L shoulder biceps tenodesis and lysis of adhesions.  He is doing well. He has been to the MD who is happy with his progression.   The patient  is a little sore after the last visit.  PERTINENT HISTORY: Left shoulder skin cancer 2011; parkinson's    PAIN:  Are you having pain? Yes: NPRS scale:  3/10 pain currently  8/24 Pain location: Distal biceps area  Pain description: acing  Aggravating factors: use of the shoulder  Relieving factors: rest and pain medication   PRECAUTIONS: Shoulder follow protocol   WEIGHT BEARING RESTRICTIONS Yes NWB follow protocol   FALLS:  Has patient fallen in last 6 months? 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[employer]; Hitting a heavy bag  PLOF: Independent  PATIENT GOALS   Being able to use his left arm again   OBJECTIVE:   DIAGNOSTIC FINDINGS:  Nothing post op   PATIENT SURVEYS:  FOTO       TODAY'S TREATMENT:  9/12 Supine ABC 4lb x1 3lb x1  SLER 3lbs x20  Supine flexion x20 3lbs   Shoulder extension 2x20 blue  Shoulder row 2x20 blue   IR band Green 2x15   Wall vs towell CW and CCW x15   Wall towel flexion 2x15     Pt received Grade II/III AP/inf mobs to improve pain, ROM, and to normalize arthrokinematics f/b L Shoulder PROM in flexion, abd, ER, and IR per pt and tissue tolerance.Pec release with active stretch of abduction.     9/5 Supine ABC 4lb x1 3lb x1  SLER 3lbs x20  Supine flexion x20 3lbs   Shoulder extension 2x20 blue  Shoulder row 2x20 blue   IR band Green 2x15    Pt received Grade II/III AP/inf mobs to improve pain, ROM, and to normalize arthrokinematics f/b L Shoulder PROM in flexion, abd, ER, and IR per pt and tissue tolerance.Pec  release with active stretch of abduction.   8/24 Supine ABC 4lb 2x1 SLER 3lbs x20  Supine flexion x20 3lbs   Standing flexion and Y x15 each 2lbs   Row 2x15 15 lbs  Extension 2x15 15 lbs cables   Doorway pec stretch 3x20 sec hold with cuing for intensity of stretch   Pt received Grade II/III AP/inf mobs to improve pain, ROM, and to normalize arthrokinematics f/b L Shoulder PROM in flexion, abd, ER, and IR per pt and tissue tolerance.Pec release with active stretch of abduction.   8/24 Manual Therapy: Pt received Grade II/III AP/inf mobs to improve pain, ROM, and to normalize arthrokinematics f/b L Shoulder PROM in flexion, abd, ER, and IR per pt and tissue tolerance.  Supine ABC 3lb 2x1  Supine flexion 2lbs 2x15  Supine scaption  2x10 2lb   Towel stretch 10x 5 sec hold   Prone row x20 2lbs Prone extension 2lbs x20   Side lying ER x20 2lbs                PATIENT EDUCATION: Education details: reviewed HEP and symptom management; progression of activity  Person educated: Patient Education method: Explanation, Demonstration, Tactile cues, Verbal cues, and Handouts Education comprehension: verbalized understanding, returned demonstration, verbal cues required, tactile cues required, and needs further education   HOME EXERCISE PROGRAM: Access Code: [email or web address] URL: [email or web address] Date: 05/13/2022 Prepared by: Carolyne Littles  Exercises - Seated Shoulder Flexion Towel Slide at Table Top  - 3 x daily - 7 x weekly - 3 sets - 10 reps - Circular Shoulder Pendulum with Table Support  - 31 x daily - 7 x weekly - 3 sets - 10 reps - Supine Shoulder External Rotation in 45 Degrees Abduction  AAROM with Dowel  - 3 x daily - 7 x weekly - 3 sets - 10 reps  ASSESSMENT:  CLINICAL IMPRESSION: The patient is making great progress. He had mild pain coming in from stretching IR. He is doing well with all of his exercises. He had full range today. He may D/C next  week if he is comfortable with his program. We added in reaching over head with towel and towel circles. He was advised to bring any questions he has next week with an expected D/C to HEP.   OBJECTIVE IMPAIRMENTS decreased activity tolerance, decreased ROM, decreased strength, impaired UE functional use, and pain.   ACTIVITY LIMITATIONS sleeping, dressing, and reach over head  PARTICIPATION LIMITATIONS: meal prep, cleaning, laundry, community activity, occupation, and yard work  PERSONAL FACTORS None   REHAB POTENTIAL: Excellent  CLINICAL DECISION MAKING: Stable/uncomplicated  EVALUATION COMPLEXITY: Low   GOALS: Goals reviewed with patient? Yes  SHORT TERM GOALS: Target date: 06/10/2022   Patient will increase passive shoulder ER to 45 degrees  Baseline: Goal status: INITIAL  2.  Patient will increase passive shoulder flexion to 130 degrees  Baseline:  Goal status: INITIAL  3.  Patient will be independent with base exercise program per protocol  Baseline:  Goal status: INITIAL  LONG TERM GOALS: Target date: 07/08/2022    Patient will be return to the gym without pain  Baseline:  Goal status: INITIAL  2.  Patient will demonstrate 80% of strength between left and right in order ot perfrom ADL's  Baseline:  Goal status: INITIAL  3.  Patient will use his arm for all functional tasks without pain  Baseline:  Goal status: INITIAL   PLAN: PT FREQUENCY: 3x/week 2nd to manip   PT DURATION: 6 weeks  PLANNED INTERVENTIONS: Therapeutic exercises, Therapeutic activity, Neuromuscular re-education, Balance training, Gait training, Patient/Family education, Joint mobilization, Aquatic Therapy, Dry Needling, Cryotherapy, Moist heat, Taping, Ultrasound, and Manual therapy  PLAN FOR NEXT SESSION:  PROM ->AAROM as tolerated, continue periscap strength. Follow Dr Wenda Low protocol tenodesis but also be aware he had a manip and lysis of adhesions.    Burnis Medin PT DPT  07/15/22 9:08  AM

## 2022-07-21 ENCOUNTER — Ambulatory Visit (HOSPITAL_BASED_OUTPATIENT_CLINIC_OR_DEPARTMENT_OTHER): Payer: No Typology Code available for payment source | Admitting: Physical Therapy

## 2022-07-21 ENCOUNTER — Encounter (HOSPITAL_BASED_OUTPATIENT_CLINIC_OR_DEPARTMENT_OTHER): Payer: Self-pay | Admitting: Physical Therapy

## 2022-07-21 DIAGNOSIS — M25512 Pain in left shoulder: Secondary | ICD-10-CM | POA: Diagnosis not present

## 2022-07-21 DIAGNOSIS — M25612 Stiffness of left shoulder, not elsewhere classified: Secondary | ICD-10-CM

## 2022-07-21 DIAGNOSIS — M6281 Muscle weakness (generalized): Secondary | ICD-10-CM

## 2022-07-21 NOTE — Therapy (Signed)
OUTPATIENT PHYSICAL THERAPY TREATMENT NOTE/Discharge    Patient Name: Casey Allen MRN: [id number] DOB:10/11/1971, 51 y.o., male Today's Date: 07/21/2022   PT End of Session - 07/21/22 0817     Visit Number 16    Number of Visits 24    Date for PT Re-Evaluation 07/08/22    PT Start Time 0800    PT Stop Time 0842    PT Time Calculation (min) 42 min    Activity Tolerance Patient tolerated treatment well    Behavior During Therapy [facility] for tasks assessed/performed                    Past Medical History:  Diagnosis Date   Allergic rhinitis    Allergy    Asthma    Cancer ([place])    skin cancer- left shoulder   GERD (gastroesophageal reflux disease)    Headache(784.0)    History of chickenpox    History of nephrolithiasis    Hypertension    Skin exam, screening for cancer    sees Dr. Annamary Carolin    Past Surgical History:  Procedure Laterality Date   COLONOSCOPY  12/06/2019   per Dr. 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 Corrie Mckusick and then had MOHS on 2/11/110 per DR. 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   Chronic cough 08/23/2021   Exercise-induced asthma 04/14/2018   HTN (hypertension) 06/09/2014   LIPOMA 09/18/2008   ALLERGIC RHINITIS 07/28/2008   GERD 07/28/2008   HEADACHE 07/28/2008   NEPHROLITHIASIS, HX OF 07/28/2008    PCP: Gertie Fey   REFERRING PROVIDER: Dr Ophelia Charter   REFERRING DIAG: Left Lysis of adhesions and biceps tenodesis   THERAPY DIAG:  Acute pain of left shoulder  Stiffness of left shoulder, not elsewhere classified  Muscle weakness (generalized)  Rationale for Evaluation and Treatment Rehabilitation  ONSET DATE:  05/11/2022  SUBJECTIVE:                                                                                                                                                                                      SUBJECTIVE STATEMENT: The patient continues to have end range pain with abduction. He feels like otherwise he is doing well. He will continue with his exercises on his own at home.   The patient  is a little sore after the last visit.  PERTINENT HISTORY: Left shoulder skin cancer 2011; parkinson's    PAIN:  Are you having pain? Yes: NPRS scale:  3/10 pain currently  8/24 Pain location: Distal biceps area  Pain description: acing  Aggravating factors: use of the shoulder  Relieving factors: rest and pain medication   PRECAUTIONS: Shoulder follow protocol   WEIGHT BEARING RESTRICTIONS Yes NWB follow protocol   FALLS:  Has patient fallen in last 6 months? 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    9/12 Supine ABC 4lb x1 3lb x1  SLER 3lbs x20  Supine flexion x20 3lbs   Shoulder extension 2x20 blue  Shoulder row 2x20 blue   IR band Green 2x15   Wall vs towell CW and CCW x15   Wall towel flexion 2x15     Pt received Grade II/III AP/inf mobs to improve pain, ROM, and to normalize arthrokinematics f/b L Shoulder PROM in flexion, abd, ER, and IR per pt  and tissue tolerance.Pec release with active stretch of abduction.     9/5 Supine ABC 4lb x1 3lb x1  SLER 3lbs x20  Supine flexion x20 3lbs   Shoulder extension 2x20 blue  Shoulder row 2x20 blue   IR band Green 2x15    Pt received Grade II/III AP/inf mobs to improve pain, ROM, and to normalize arthrokinematics f/b L Shoulder PROM in flexion, abd, ER, and IR per pt and tissue tolerance.Pec release with active stretch of abduction.                PATIENT EDUCATION: Education details: reviewed HEP and symptom management; progression of activity  Person educated: Patient Education method: Explanation, Demonstration, Tactile cues, Verbal cues, and Handouts Education comprehension: verbalized understanding, returned demonstration, verbal cues required, tactile cues required, and needs further education   HOME EXERCISE PROGRAM: Access Code: [email or web address] URL: [email or web address] Date: 05/13/2022 Prepared by: Carolyne Littles  Exercises - Seated Shoulder Flexion Towel Slide at Table Top  - 3 x daily - 7 x weekly - 3 sets - 10 reps - Circular Shoulder Pendulum with Table Support  - 31 x daily - 7 x weekly - 3 sets - 10 reps - Supine Shoulder External Rotation in 45 Degrees Abduction AAROM with Dowel  - 3 x daily - 7 x weekly - 3 sets - 10 reps  ASSESSMENT:  CLINICAL IMPRESSION: The patient continues to make good progress. He has minor pain with end range abduction and active abduction. He was given tow exercises today to work on end range abduction. He tolerated all other exercise well. He continues to work on exercises to improve his active IR. He was advised to continue with his exercises for about 3 months. He has all the exercises he needs though and  no further need for functional strengthening. The patient has reached all goals for therapy. D/C at this time to HEP.  OBJECTIVE IMPAIRMENTS decreased activity tolerance, decreased ROM, decreased strength, impaired  UE functional use, and pain.   ACTIVITY LIMITATIONS sleeping, dressing, and reach over head  PARTICIPATION LIMITATIONS: meal prep, cleaning, laundry, community activity, occupation, and yard work  PERSONAL FACTORS None   REHAB POTENTIAL: Excellent  CLINICAL DECISION MAKING: Stable/uncomplicated  EVALUATION COMPLEXITY: Low   GOALS: Goals reviewed with patient? Yes  SHORT TERM GOALS: Target date: 06/10/2022   Patient will increase passive shoulder ER to 45 degrees  Baseline: Goal status: full achieved   2.  Patient will increase passive shoulder flexion to 130 degrees  Baseline:  Goal status: full achieved   3.  Patient will be independent with base exercise program per protocol  Baseline:  Goal status: Achieved   LONG TERM GOALS: Target date: 07/08/2022    Patient will be return to the gym without pain  Baseline:  Goal status: Performing gym exercises but not back to the gym yet   2.  Patient will demonstrate 80% of strength between left and right in order ot perfrom ADL's  Baseline:  Goal status: ]Achieved   3.  Patient will use his arm for all functional tasks without pain  Baseline:  Goal status: Achieved    PLAN: PT FREQUENCY: 3x/week 2nd to manip   PT DURATION: 6 weeks  PLANNED INTERVENTIONS: Therapeutic exercises, Therapeutic activity, Neuromuscular re-education, Balance training, Gait training, Patient/Family education, Joint mobilization, Aquatic Therapy, Dry Needling, Cryotherapy, Moist heat, Taping, Ultrasound, and Manual therapy  PLAN FOR NEXT SESSION:  PROM ->AAROM as tolerated, continue periscap strength. Follow Dr Wenda Low protocol tenodesis but also be aware he had a manip and lysis of adhesions.    Burnis Medin PT DPT  07/21/22 9:41 AM

## 2022-07-31 ENCOUNTER — Other Ambulatory Visit: Payer: Self-pay | Admitting: Family Medicine

## 2022-08-05 NOTE — Progress Notes (Unsigned)
Assessment/Plan:   1.  Parkinsons Disease  -continue carbidopa/levodopa 25/100 CR tid  -We discussed that it used to be thought that levodopa would increase risk of melanoma but now it is believed that Parkinsons itself likely increases risk of melanoma. he is to get regular skin checks.  He did this last month  2.  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  Subjective:   Casey Allen was seen today in follow up for Parkinsons disease, diagnosed last visit.  My previous records were reviewed prior to todays visit as well as outside records available to me.  Levodopa was initiated.  Not long after our last visit, he requested a second opinion at [facility].  He had a second opinion May 31 with Dr. Buck Mam and I reviewed those notes.  She agreed with the diagnosis.  She agreed with continuing levodopa.  He emailed me after that visit stating that he was nauseated after taking levodopa.  We started him on some Zofran, but I told him if he needed more than 15 tablets/month, we would switch to the version of levodopa.  We ultimately did that at the end of July.  He was not really taking a lot of Zofran, but was staying fairly nauseated.  He does state that changing to CR helped the nausea.  It may not have been as effective.    Pt denies falls.  Pt denies lightheadedness, near syncope.  No hallucinations.  Mood has been good.  He has been to therapies since our last visit.  He is running and biking for exercise.    Current prescribed movement disorder medications: Carbidopa/levodopa 25/100 CR, 1 tablet 3 times per day (started last visit) Zofran as needed  PREVIOUS MEDICATIONS: carbidopa/levodopa 25/100 IR (some nausea)  ALLERGIES:   Allergies  Allergen Reactions   Cats Claw (Uncaria Tomentosa)     CURRENT MEDICATIONS:  Current Meds  Medication Sig   albuterol (VENTOLIN HFA) 108 (90 Base) MCG/ACT inhaler INHALE 2 PUFFS INTO THE LUNGS EVERY 4 HOURS AS NEEDED FOR  WHEEZING OR SHORTNESS OF BREATH (BEFORE EXERCISE)   amLODipine (NORVASC) 10 MG tablet Take 1 tablet (10 mg total) by mouth daily.   Carbidopa-Levodopa ER (SINEMET CR) 25-100 MG tablet controlled release 1 po tid at 7am/11am/4pm   Cyanocobalamin (B-12) 2500 MCG TABS    Fish Oil OIL by Does not apply route.   fluticasone (FLONASE) 50 MCG/ACT nasal spray USE TWO SPRAYS IN EACH NOSTRIL DAILY    hydrochlorothiazide (HYDRODIURIL) 25 MG tablet Take 1 tablet (25 mg total) by mouth daily.   LORATADINE-D 24HR 10-240 MG 24 hr tablet Take 1 tablet by mouth daily.   Misc Natural Products (GLUCOSAMINE CHOND COMPLEX/MSM PO) Take by mouth daily.   Multiple Vitamin (MULTIVITAMIN) tablet Take 1 tablet by mouth daily.   omeprazole (PRILOSEC) 40 MG capsule Take 1 capsule (40 mg total) by mouth daily.   ondansetron (ZOFRAN) 4 MG tablet Take 1 tablet (4 mg total) by mouth every 8 (eight) hours as needed for nausea or vomiting.   Vitamin D, Cholecalciferol, 1000 UNITS CAPS Take by mouth daily.     Objective:   PHYSICAL EXAMINATION:    VITALS:   Vitals:   08/06/22 0946  BP: 131/82  Pulse: 98  SpO2: 100%  Weight: 198 lb (89.8 kg)  Height: '6\' 2"'$  (1.88 m)    GEN:  The patient appears stated age and is in NAD. HEENT:  Normocephalic, atraumatic.  The mucous membranes  are moist. The superficial temporal arteries are without ropiness or tenderness. CV:  RRR Lungs:  CTAB Neck/HEME:  There are no carotid bruits bilaterally.  Neurological examination:  Orientation: The patient is alert and oriented x3.  Cranial nerves: There is good facial symmetry.  Extraocular muscles are intact. The visual fields are full to confrontational testing. The speech is fluent and clear. Soft palate rises symmetrically and there is no tongue deviation. Hearing is intact to conversational tone. Sensation: Sensation is intact to light touch throughout (facial, trunk, extremities). Vibration is intact at the bilateral big toe. There  is no extinction with double simultaneous stimulation.  Motor: Strength is 5/5 in the bilateral upper and lower extremities.   Shoulder shrug is equal and symmetric.  There is no pronator drift. Deep tendon reflexes: Deep tendon reflexes are 1/4 at the bilateral biceps, triceps, brachioradialis, patella and achilles. Plantar responses are downgoing bilaterally.   Movement examination: Tone: There is normal tone in the UE/LE Abnormal movements: there is no rest tremor.   Coordination:  There is no significant decremation today Gait and Station: The patient has no difficulty arising out of a deep-seated chair without the use of the hands. The patient's stride length is good with decreased arm swing on the left.  The patient has a negative pull test.    I have reviewed and interpreted the following labs independently    Chemistry      Component Value Date/Time   NA 140 02/10/2022 0945   K 3.7 02/10/2022 0945   CL 100 02/10/2022 0945   CO2 32 02/10/2022 0945   BUN 16 02/10/2022 0945   CREATININE 1.30 02/10/2022 0945      Component Value Date/Time   CALCIUM 9.4 02/10/2022 0945   ALKPHOS 40 02/10/2022 0945   AST 16 02/10/2022 0945   ALT 13 02/10/2022 0945   BILITOT 0.7 02/10/2022 0945       Lab Results  Component Value Date   WBC 6.0 02/10/2022   HGB 13.6 02/10/2022   HCT 39.5 02/10/2022   MCV 93.4 02/10/2022   PLT 230.0 02/10/2022    Lab Results  Component Value Date   TSH 0.80 02/10/2022      Cc:  Laurey Morale, MD

## 2022-08-06 ENCOUNTER — Ambulatory Visit (INDEPENDENT_AMBULATORY_CARE_PROVIDER_SITE_OTHER): Payer: No Typology Code available for payment source | Admitting: Neurology

## 2022-08-06 ENCOUNTER — Encounter: Payer: Self-pay | Admitting: Neurology

## 2022-08-06 VITALS — BP 131/82 | HR 98 | Ht 74.0 in | Wt 198.0 lb

## 2022-08-06 DIAGNOSIS — G20A1 Parkinson's disease without dyskinesia, without mention of fluctuations: Secondary | ICD-10-CM

## 2022-08-06 NOTE — Patient Instructions (Signed)
Local and Online Resources for Power over Parkinson's Group September 2023  LOCAL [place] PARKINSON'S GROUPS  Power over Parkinson's Group:   Power Over Parkinson's Patient Education Group will be Wednesday, September 13th-*Hybrid meting*- in person at [facility] location and via [facility] at 2 pm.   Upcoming Power over [employer] Meetings:  2nd Wednesdays of the month at 2 pm:  September 13th, October 11th, November 8th Contact Amy Marriott at [email or web address] if interested in participating in this group Parkinson's Care Partners Group:    3rd Mondays, Contact Misty Paladino Atypical Parkinsonian Patient Group:   4th Wednesdays, Contact Misty Paladino If you are interested in participating in these groups with Misty, please contact her directly for how to join those meetings.  Her contact information is [email or web address].    LOCAL EVENTS AND NEW OFFERINGS New PWR! Moves Dynegy Instructor-Led Classes offering at [employer]!  Wednesdays 1-2 pm.   Contact Vonna Kotyk at  [email or web address] or Caron Presume at [place], [email or web address] Dance for Parkinson 's classes will be on Tuesdays 9:30am-10:30am starting October 3-December 12 with a break the week of November 21 . Located in the December 04 which is in the first floor of the [employer]  ([place] for Parkinson's will be held on 2nd and 4th Mondays at 11:00am . First class will start  September 25th.  Located at the [place] ([place] Through support from the [place] and Drumming for Parkinson's classes are free for both patients and caregivers.  Contact Misty Taylor-Paladino for more details about registering.  [place]:  [email or web address] PD Health at Home continues:  Mindfulness Mondays, Wellness  Wednesdays, Fitness Fridays  Upcoming Education:   Navigating Nutrition with PD.  Wednesday, Sept. 6th 1:00-2:00 pm Understanding Mind and Memory.  Wednesday, Sept. 20th 1:00-2:00 pm  Expert Briefing:    Parkinson's Disease and the Bladder.  Wednesday, Sept. 13th 1:00-2:00 pm Parkinson's and the Gut-Brain Connection.  Wednesday, Oct. 11th 1:00-2:00 pm Register for expert briefings (webinars) at 04-19-1978 Please check out their website to sign up for emails and see their full online offerings   [place]:  [email or web address]  Third Thursday Webinars:  On the third Thursday of every month at 12 p.m. ET, join our free live webinars to learn about various aspects of living with Parkinson's disease and our work to speed medical breakthroughs. Upcoming Webinar:  Stay tuned Check out additional information on their website to see their full online offerings  Sunday:  [email or web address] Upcoming Webinar:   Stay tuned Webinar Series:  Living with Parkinson's Meetup.   Third Thursdays each month, 3 pm Care Partner Monthly Meetup.  With 12-18-1987 [place].  First Tuesday of each month, 2 pm Check out additional information to Live Well Today on their website  Parkinson and Movement Disorders (PMD) Alliance:  www.pmdalliance.org [employer] Online:  Online Education Events Sign up for emails, which are sent weekly to give you updates on programming and online offerings  Parkinson's Association of the [place]:  www.[email or web address] Information on online support groups, education events, and online exercises including Yoga, Parkinson's exercises and more-LOTS of information on links to PD resources and online events Virtual Support Group through Parkinson's Association of the [place]; next one is scheduled for Wednesday, October 4th at 2 pm. (No September meeting  due to the symposium.  These are typically scheduled for the 1st Wednesday  of the month at 2 pm).  Visit website for details. Register for "Caring for Parkinson's-Caring for You", 9th Annual Symposium.  In-person event in [place].  September 9th.  To register:  [email or web address]/symposium-[email or web address]/?blm_aid=[email or web address] MOVEMENT AND EXERCISE OPPORTUNITIES PWR! Moves Classes at [place].  Wednesdays 10 and 11 am.   Contact Amy Marriott, PT [email or web address] if interested. NEW PWR! Moves Class offerings at [employer].  Wednesdays 1-2 pm.  Contact Vonna Kotyk at  [email or web address] or Caron Presume at [place],  [email or web address] Parkinson's Wellness Recovery (PWR! Moves)  www.[email or web address] Info on the PWR! Virtual Experience:  You will have access to our expertise through self-assessment, guided plans that start with the PD-specific fundamentals, educational content, tips, Q&A with an expert, and a growing Art therapist of PD-specific pre-recorded and live exercise classes of varying types and intensity - both physical and cognitive! If that is not enough, we offer 1:1 wellness consultations (in-person or virtual) to personalize your PWR! Research scientist (medical).  [place] Fridays:  As part of the PD Health @ Home program, this free video series focuses each week on one aspect of fitness designed to support people living with Parkinson's.  These weekly videos highlight the [place] recent fitness guidelines for people with Parkinson's disease. [employer]si Dance for PD website is offering free, live-stream classes throughout the week, as well as links to [employer] of classes:  [email or web address]/ Virtual dance and Pilates for Parkinson's classes: Click on the Community Tab> Parkinson's Movement Initiative Tab.  To register for classes and for more  information, visit [email or web address] and click the "community" tab.  [employer] Parkinson's Cycling Classes  [employer]:  Thursdays @ Noon-Live classes at [employer] ([employer] at [email or web address] or [phone]) [employer]: Virtual Classes Mondays and Thursdays Jeanette Caprice classes Tuesday, Wednesday and Thursday (contact [place] at [email or web address]  or [phone]) [place] Varied levels of classes are offered Tuesdays and Thursdays at [facility].  Stretching with Verdis Frederickson weekly class is also offered for people with Parkinson's To observe a class or for more information, call [phone] or email Hezzie Bump at [email or web address] ADDITIONAL SUPPORT AND RESOURCES Well[employer]Spring [employer]:Online Caregiver Education Opportunities:  www.well-[email or web address]/caregiver-education/caregiver-support-group.  You may also contact Vickki Muff at [email or web address] or [phone].    Coping with Difficult Caregiver Emotions.  Wednesday, September 20th, 10:30 am-12.  [facility] Navigating the Maze of Senior Care Options.  Thursday, September 28th, 4-5:15 pm.  [facility]. Well-Spring Navigator:  [facility] program, a free service to help individuals and families through the journey of determining care for older adults.  The "Navigator" is a [employer], Education officer, museum, who will speak with a prospective client and/or loved ones to provide an assessment of the situation and a set of recommendations for a personalized care plan -- all free of charge, and whether Well[employer] offers the needed service or not. If the need is not a service we provide, we are well-connected with reputable programs in town that we can refer you to.  www.well-springsolutions.org or to speak with the Navigator, call [phone].

## 2022-11-17 ENCOUNTER — Encounter: Payer: Self-pay | Admitting: Gastroenterology

## 2022-11-29 ENCOUNTER — Emergency Department (HOSPITAL_BASED_OUTPATIENT_CLINIC_OR_DEPARTMENT_OTHER): Payer: 59

## 2022-11-29 ENCOUNTER — Other Ambulatory Visit: Payer: Self-pay

## 2022-11-29 ENCOUNTER — Emergency Department (EMERGENCY_DEPARTMENT_HOSPITAL): Payer: 59 | Admitting: Anesthesiology

## 2022-11-29 ENCOUNTER — Ambulatory Visit (HOSPITAL_BASED_OUTPATIENT_CLINIC_OR_DEPARTMENT_OTHER)
Admission: EM | Admit: 2022-11-29 | Discharge: 2022-11-29 | Disposition: A | Discharge status: Home / Self Care | Payer: 59 | Attending: Emergency Medicine | Admitting: Emergency Medicine

## 2022-11-29 ENCOUNTER — Emergency Department (HOSPITAL_COMMUNITY): Payer: 59 | Admitting: Anesthesiology

## 2022-11-29 ENCOUNTER — Encounter (HOSPITAL_COMMUNITY)
Admission: EM | Disposition: A | Discharge status: Home / Self Care | Payer: Self-pay | Source: Home / Self Care | Attending: Emergency Medicine

## 2022-11-29 ENCOUNTER — Encounter (HOSPITAL_BASED_OUTPATIENT_CLINIC_OR_DEPARTMENT_OTHER): Payer: Self-pay

## 2022-11-29 DIAGNOSIS — I517 Cardiomegaly: Secondary | ICD-10-CM | POA: Insufficient documentation

## 2022-11-29 DIAGNOSIS — J45909 Unspecified asthma, uncomplicated: Secondary | ICD-10-CM | POA: Diagnosis not present

## 2022-11-29 DIAGNOSIS — K3580 Unspecified acute appendicitis: Secondary | ICD-10-CM | POA: Diagnosis not present

## 2022-11-29 DIAGNOSIS — K219 Gastro-esophageal reflux disease without esophagitis: Secondary | ICD-10-CM | POA: Diagnosis not present

## 2022-11-29 DIAGNOSIS — N281 Cyst of kidney, acquired: Secondary | ICD-10-CM | POA: Diagnosis not present

## 2022-11-29 DIAGNOSIS — I10 Essential (primary) hypertension: Secondary | ICD-10-CM | POA: Diagnosis not present

## 2022-11-29 DIAGNOSIS — R519 Headache, unspecified: Secondary | ICD-10-CM | POA: Insufficient documentation

## 2022-11-29 DIAGNOSIS — K3589 Other acute appendicitis without perforation or gangrene: Secondary | ICD-10-CM | POA: Diagnosis present

## 2022-11-29 DIAGNOSIS — G20A1 Parkinson's disease without dyskinesia, without mention of fluctuations: Secondary | ICD-10-CM | POA: Insufficient documentation

## 2022-11-29 HISTORY — PX: LAPAROSCOPIC APPENDECTOMY: SHX408

## 2022-11-29 LAB — URINALYSIS, ROUTINE W REFLEX MICROSCOPIC
Bacteria, UA: NONE SEEN
Bilirubin Urine: NEGATIVE
Glucose, UA: NEGATIVE mg/dL
Hgb urine dipstick: NEGATIVE
Ketones, ur: NEGATIVE mg/dL
Leukocytes,Ua: NEGATIVE
Nitrite: NEGATIVE
Protein, ur: NEGATIVE mg/dL
Specific Gravity, Urine: 1.016 (ref 1.005–1.030)
pH: 8 (ref 5.0–8.0)

## 2022-11-29 LAB — TROPONIN I (HIGH SENSITIVITY)
Troponin I (High Sensitivity): 3 ng/L (ref <18)
Troponin I (High Sensitivity): 3 ng/L (ref <18)

## 2022-11-29 LAB — CBC
HCT: 40.7 % (ref 39.0–52.0)
Hemoglobin: 13.7 g/dL (ref 13.0–17.0)
MCH: 31.2 pg (ref 26.0–34.0)
MCHC: 33.7 g/dL (ref 30.0–36.0)
MCV: 92.7 fL (ref 80.0–100.0)
Platelets: 205 10*3/uL (ref 150–400)
RBC: 4.39 MIL/uL (ref 4.22–5.81)
RDW: 12.4 % (ref 11.5–15.5)
WBC: 9.4 10*3/uL (ref 4.0–10.5)
nRBC: 0 % (ref 0.0–0.2)

## 2022-11-29 LAB — COMPREHENSIVE METABOLIC PANEL
ALT: 12 U/L (ref 0–44)
AST: 16 U/L (ref 15–41)
Albumin: 4.6 g/dL (ref 3.5–5.0)
Alkaline Phosphatase: 34 U/L — ABNORMAL LOW (ref 38–126)
Anion gap: 10 (ref 5–15)
BUN: 15 mg/dL (ref 6–20)
CO2: 30 mmol/L (ref 22–32)
Calcium: 9.2 mg/dL (ref 8.9–10.3)
Chloride: 99 mmol/L (ref 98–111)
Creatinine, Ser: 1.33 mg/dL — ABNORMAL HIGH (ref 0.61–1.24)
GFR, Estimated: >60 mL/min (ref >60)
Glucose, Bld: 148 mg/dL — ABNORMAL HIGH (ref 70–99)
Potassium: 3.5 mmol/L (ref 3.5–5.1)
Sodium: 139 mmol/L (ref 135–145)
Total Bilirubin: 0.7 mg/dL (ref 0.3–1.2)
Total Protein: 7.3 g/dL (ref 6.5–8.1)

## 2022-11-29 LAB — LIPASE, BLOOD: Lipase: 18 U/L (ref 11–51)

## 2022-11-29 SURGERY — APPENDECTOMY, LAPAROSCOPIC
Anesthesia: General

## 2022-11-29 MED ORDER — LIDOCAINE 2% (20 MG/ML) 5 ML SYRINGE
INTRAMUSCULAR | Status: DC | PRN
  Administered 2022-11-29: 60 mg via INTRAVENOUS

## 2022-11-29 MED ORDER — DEXAMETHASONE SODIUM PHOSPHATE 10 MG/ML IJ SOLN
INTRAMUSCULAR | Status: DC | PRN
  Administered 2022-11-29: 10 mg via INTRAVENOUS

## 2022-11-29 MED ORDER — METRONIDAZOLE 500 MG/100ML IV SOLN
500.0000 mg | Freq: Once | INTRAVENOUS | Status: AC
  Administered 2022-11-29: 500 mg via INTRAVENOUS
  Filled 2022-11-29: qty 100

## 2022-11-29 MED ORDER — BUPIVACAINE HCL (PF) 0.25 % IJ SOLN
INTRAMUSCULAR | Status: AC
  Filled 2022-11-29: qty 30

## 2022-11-29 MED ORDER — SODIUM CHLORIDE 0.9 % IV SOLN
2.0000 g | Freq: Once | INTRAVENOUS | Status: AC
  Administered 2022-11-29: 2 g via INTRAVENOUS
  Filled 2022-11-29: qty 20

## 2022-11-29 MED ORDER — KETOROLAC TROMETHAMINE 30 MG/ML IJ SOLN: 30.0000 mg | Freq: Once | INTRAMUSCULAR | Status: DC

## 2022-11-29 MED ORDER — ONDANSETRON HCL 4 MG/2ML IJ SOLN
INTRAMUSCULAR | Status: DC | PRN
  Administered 2022-11-29: 4 mg via INTRAVENOUS

## 2022-11-29 MED ORDER — PROMETHAZINE HCL 25 MG/ML IJ SOLN: 6.2500 mg | INTRAMUSCULAR | Status: DC | PRN

## 2022-11-29 MED ORDER — AMISULPRIDE (ANTIEMETIC) 5 MG/2ML IV SOLN: 10.0000 mg | Freq: Once | INTRAVENOUS | Status: DC | PRN

## 2022-11-29 MED ORDER — ORAL CARE MOUTH RINSE: 15.0000 mL | Freq: Once | OROMUCOSAL | Status: AC

## 2022-11-29 MED ORDER — FENTANYL CITRATE (PF) 250 MCG/5ML IJ SOLN
INTRAMUSCULAR | Status: DC | PRN
  Administered 2022-11-29: 25 ug via INTRAVENOUS
  Administered 2022-11-29: 150 ug via INTRAVENOUS

## 2022-11-29 MED ORDER — OXYCODONE HCL 5 MG/5ML PO SOLN
ORAL | Status: AC
  Filled 2022-11-29: qty 5

## 2022-11-29 MED ORDER — IOHEXOL 300 MG/ML  SOLN
100.0000 mL | Freq: Once | INTRAMUSCULAR | Status: AC | PRN
  Administered 2022-11-29: 100 mL via INTRAVENOUS

## 2022-11-29 MED ORDER — LACTATED RINGERS IV SOLN: INTRAVENOUS | Status: DC | PRN

## 2022-11-29 MED ORDER — FENTANYL CITRATE (PF) 250 MCG/5ML IJ SOLN
INTRAMUSCULAR | Status: AC
  Filled 2022-11-29: qty 5

## 2022-11-29 MED ORDER — OXYCODONE HCL 5 MG/5ML PO SOLN
5.0000 mg | Freq: Once | ORAL | Status: AC | PRN
  Administered 2022-11-29: 5 mg via ORAL

## 2022-11-29 MED ORDER — PHENYLEPHRINE 80 MCG/ML (10ML) SYRINGE FOR IV PUSH (FOR BLOOD PRESSURE SUPPORT)
PREFILLED_SYRINGE | INTRAVENOUS | Status: DC | PRN
  Administered 2022-11-29: 80 ug via INTRAVENOUS

## 2022-11-29 MED ORDER — LACTATED RINGERS IV SOLN: INTRAVENOUS | Status: DC

## 2022-11-29 MED ORDER — 0.9 % SODIUM CHLORIDE (POUR BTL) OPTIME
TOPICAL | Status: DC | PRN
  Administered 2022-11-29: 1000 mL

## 2022-11-29 MED ORDER — FENTANYL CITRATE (PF) 100 MCG/2ML IJ SOLN: 25.0000 ug | INTRAMUSCULAR | Status: DC | PRN

## 2022-11-29 MED ORDER — FENTANYL CITRATE PF 50 MCG/ML IJ SOSY
50.0000 ug | PREFILLED_SYRINGE | Freq: Once | INTRAMUSCULAR | Status: AC
  Administered 2022-11-29: 50 ug via INTRAVENOUS
  Filled 2022-11-29: qty 1

## 2022-11-29 MED ORDER — MIDAZOLAM HCL 2 MG/2ML IJ SOLN
INTRAMUSCULAR | Status: DC | PRN
  Administered 2022-11-29: 2 mg via INTRAVENOUS

## 2022-11-29 MED ORDER — SUGAMMADEX SODIUM 200 MG/2ML IV SOLN
INTRAVENOUS | Status: DC | PRN
  Administered 2022-11-29: 200 mg via INTRAVENOUS

## 2022-11-29 MED ORDER — PHENYLEPHRINE 80 MCG/ML (10ML) SYRINGE FOR IV PUSH (FOR BLOOD PRESSURE SUPPORT)
PREFILLED_SYRINGE | INTRAVENOUS | Status: AC
  Filled 2022-11-29: qty 10

## 2022-11-29 MED ORDER — ROCURONIUM BROMIDE 10 MG/ML (PF) SYRINGE
PREFILLED_SYRINGE | INTRAVENOUS | Status: DC | PRN
  Administered 2022-11-29: 50 mg via INTRAVENOUS

## 2022-11-29 MED ORDER — MIDAZOLAM HCL 2 MG/2ML IJ SOLN
INTRAMUSCULAR | Status: AC
  Filled 2022-11-29: qty 2

## 2022-11-29 MED ORDER — PROPOFOL 10 MG/ML IV BOLUS
INTRAVENOUS | Status: DC | PRN
  Administered 2022-11-29: 200 mg via INTRAVENOUS

## 2022-11-29 MED ORDER — OXYCODONE HCL 5 MG PO TABS: 5.0000 mg | ORAL_TABLET | Freq: Once | ORAL | Status: AC | PRN

## 2022-11-29 MED ORDER — MORPHINE SULFATE (PF) 4 MG/ML IV SOLN
4.0000 mg | Freq: Once | INTRAVENOUS | Status: AC
  Administered 2022-11-29: 4 mg via INTRAVENOUS
  Filled 2022-11-29: qty 1

## 2022-11-29 MED ORDER — SODIUM CHLORIDE 0.9 % IV SOLN: Freq: Once | INTRAVENOUS | Status: AC

## 2022-11-29 MED ORDER — CHLORHEXIDINE GLUCONATE 0.12 % MT SOLN
15.0000 mL | Freq: Once | OROMUCOSAL | Status: AC
  Administered 2022-11-29: 15 mL via OROMUCOSAL

## 2022-11-29 MED ORDER — OXYCODONE HCL 5 MG PO TABS: 5.0000 mg | ORAL_TABLET | Freq: Four times a day (QID) | ORAL | 0 refills | Status: DC | PRN

## 2022-11-29 MED ORDER — SODIUM CHLORIDE 0.9 % IR SOLN
Status: DC | PRN
  Administered 2022-11-29: 1000 mL

## 2022-11-29 MED ORDER — BUPIVACAINE HCL (PF) 0.25 % IJ SOLN
INTRAMUSCULAR | Status: DC | PRN
  Administered 2022-11-29: 10 mL

## 2022-11-29 MED ORDER — ACETAMINOPHEN 10 MG/ML IV SOLN: 1000.0000 mg | Freq: Once | INTRAVENOUS | Status: DC | PRN

## 2022-11-29 SURGICAL SUPPLY — 32 items
BLADE CLIPPER SURG (BLADE) IMPLANT
CANISTER SUCT 3000ML PPV (MISCELLANEOUS) ×1 IMPLANT
CHLORAPREP W/TINT 26 (MISCELLANEOUS) ×1 IMPLANT
COVER SURGICAL LIGHT HANDLE (MISCELLANEOUS) ×1 IMPLANT
CUTTER FLEX LINEAR 45M (STAPLE) ×1 IMPLANT
DERMABOND ADVANCED .7 DNX12 (GAUZE/BANDAGES/DRESSINGS) ×1 IMPLANT
GLOVE BIO SURGEON STRL SZ8 (GLOVE) ×1 IMPLANT
GLOVE BIOGEL PI IND STRL 8 (GLOVE) ×1 IMPLANT
GOWN STRL REUS W/ TWL XL LVL3 (GOWN DISPOSABLE) ×1 IMPLANT
GOWN STRL REUS W/TWL XL LVL3 (GOWN DISPOSABLE) ×2
KIT BASIN OR (CUSTOM PROCEDURE TRAY) ×1 IMPLANT
KIT TURNOVER KIT B (KITS) ×1 IMPLANT
NDL 22X1.5 STRL (OR ONLY) (MISCELLANEOUS) ×1 IMPLANT
NEEDLE 22X1.5 STRL (OR ONLY) (MISCELLANEOUS) ×1 IMPLANT
NS IRRIG 1000ML POUR BTL (IV SOLUTION) ×1 IMPLANT
PAD ARMBOARD 7.5X6 YLW CONV (MISCELLANEOUS) ×2 IMPLANT
POUCH RETRIEVAL ECOSAC 10 (ENDOMECHANICALS) ×1 IMPLANT
POUCH RETRIEVAL ECOSAC 10MM (ENDOMECHANICALS) ×1
RELOAD 45 VASCULAR/THIN (ENDOMECHANICALS) ×1 IMPLANT
RELOAD STAPLE 45 2.5 WHT GRN (ENDOMECHANICALS) IMPLANT
SET IRRIG TUBING LAPAROSCOPIC (IRRIGATION / IRRIGATOR) ×1 IMPLANT
SET TUBE SMOKE EVAC HIGH FLOW (TUBING) ×1 IMPLANT
SHEARS HARMONIC ACE PLUS 36CM (ENDOMECHANICALS) ×1 IMPLANT
SPECIMEN JAR SMALL (MISCELLANEOUS) ×1 IMPLANT
SUT VIC AB 4-0 PS2 27 (SUTURE) ×1 IMPLANT
TOWEL GREEN STERILE (TOWEL DISPOSABLE) ×1 IMPLANT
TRAY LAPAROSCOPIC MC (CUSTOM PROCEDURE TRAY) ×1 IMPLANT
TROCAR BALLN 12MMX100 BLUNT (TROCAR) ×1 IMPLANT
TROCAR Z THREAD OPTICAL 12X100 (TROCAR) ×1 IMPLANT
TROCAR Z-THREAD OPTICAL 5X100M (TROCAR) ×1 IMPLANT
WARMER LAPAROSCOPE (MISCELLANEOUS) ×1 IMPLANT
WATER STERILE IRR 1000ML POUR (IV SOLUTION) ×1 IMPLANT

## 2022-11-29 NOTE — ED Provider Notes (Signed)
[place] Provider Note   CSN: [id number] Arrival date & time: 11/29/22  1037     History Chief Complaint  Patient presents with   Abdominal Pain    Casey Allen is a 52 y.o. male hypertension and Parkinson's presents to the emergency room today for evaluation of periumbilical pain that he woke up with this morning.  He reports that he woke up this morning and started having some mildly superior and mildly to the left periumbilical pain that was aching and cramping in nature.  He denies any nausea, vomiting, diarrhea, fevers, urinary symptoms, testicular/scrotal symptoms, chest pain, shortness of breath.  He denies any radiation of the pain.  He reports that he normal bowel movement this morning.  Reports that the pain, feels a constipation but knows he is constipated given his normal bowel movement.  Rates the pain at a 5 out of 10.  He is currently on Sinemet and hydrochlorothiazide and amlodipine for medications.  No known drug allergies.  Has run 3 mixed drinks a week but denies any tobacco or illicit drug use.   Abdominal Pain Associated symptoms: no chest pain, no chills, no constipation, no cough, no diarrhea, no dysuria, no fever, no hematuria, no nausea, no shortness of breath, no sore throat and no vomiting        Home Medications Prior to Admission medications   Medication Sig Start Date End Date Taking? Authorizing Provider  albuterol (VENTOLIN HFA) 108 (90 Base) MCG/ACT inhaler INHALE 2 PUFFS INTO THE LUNGS EVERY 4 HOURS AS NEEDED FOR WHEEZING OR SHORTNESS OF BREATH (BEFORE EXERCISE) 01/14/22   Laurey Morale, MD  amLODipine (NORVASC) 10 MG tablet Take 1 tablet (10 mg total) by mouth daily. 02/10/22   Laurey Morale, MD  Carbidopa-Levodopa ER (SINEMET CR) 25-100 MG tablet controlled release 1 po tid at 7am/11am/4pm 06/03/22   Tat, Eustace Quail, DO  Cyanocobalamin (B-12) 2500 MCG TABS  11/03/21   [provider]  Fish Oil OIL  by Does not apply route.    [provider]  fluticasone Asencion Islam) 50 MCG/ACT nasal spray USE TWO SPRAYS IN [facility] NOSTRIL DAILY  12/16/13   Laurey Morale, MD  hydrochlorothiazide (HYDRODIURIL) 25 MG tablet Take 1 tablet (25 mg total) by mouth daily. 12/19/21   Laurey Morale, MD  LORATADINE-D 24HR 10-240 MG 24 hr tablet Take 1 tablet by mouth daily. 07/31/22   Laurey Morale, MD  Misc Natural Products (GLUCOSAMINE CHOND COMPLEX/MSM PO) Take by mouth daily.    [provider]  Multiple Vitamin (MULTIVITAMIN) tablet Take 1 tablet by mouth daily.    [provider]  omeprazole (PRILOSEC) 40 MG capsule Take 1 capsule (40 mg total) by mouth daily. 02/20/21   Laurey Morale, MD  ondansetron (ZOFRAN) 4 MG tablet Take 1 tablet (4 mg total) by mouth every 8 (eight) hours as needed for nausea or vomiting. 04/07/22   Tat, Eustace Quail, DO  Vitamin D, Cholecalciferol, 1000 UNITS CAPS Take by mouth daily.    [provider]      Allergies    Cats claw (uncaria tomentosa)    Review of Systems   Review of Systems  Constitutional:  Negative for chills and fever.  HENT:  Negative for congestion, rhinorrhea and sore throat.   Respiratory:  Negative for cough and shortness of breath.   Cardiovascular:  Negative for chest pain.  Gastrointestinal:  Positive for abdominal pain. Negative for blood  in stool, constipation, diarrhea, nausea and vomiting.  Genitourinary:  Negative for dysuria, hematuria, penile discharge, penile pain, penile swelling, scrotal swelling and testicular pain.    Physical Exam Updated Vital Signs BP (!) 140/89 (BP Location: Right Arm)   Pulse 84   Temp 97.7 F (36.5 C) (Oral)   Resp 16   Ht '6\' 2"'$  (1.88 m)   Wt 88.5 kg   SpO2 100%   BMI 25.04 kg/m  Physical Exam Vitals and nursing note reviewed.  Constitutional:      General: He is not in acute distress.    Appearance: Normal appearance. He is not ill-appearing or toxic-appearing.  HENT:     Head:  Normocephalic and atraumatic.  Eyes:     General: No scleral icterus. Cardiovascular:     Rate and Rhythm: Normal rate and regular rhythm.  Pulmonary:     Effort: Pulmonary effort is normal.     Breath sounds: Normal breath sounds.  Abdominal:     General: Abdomen is flat. Bowel sounds are normal. There is no distension.     Palpations: Abdomen is soft.     Tenderness: There is no right CVA tenderness or left CVA tenderness.       Comments: Patient reports that his pain is in the area located above.  Upon palpation however he reports that it is not worse with palpation and is not exacerbated by palpatoin.  There is no overlying skin changes noted.  Normal active bowel sounds.  Abdomen is soft.  No guarding or rebound noted.  No CVA tenderness.  Musculoskeletal:        General: No deformity.     Cervical back: Normal range of motion.  Skin:    General: Skin is warm and dry.  Neurological:     General: No focal deficit present.     Mental Status: He is alert. Mental status is at baseline.     ED Results / Procedures / Treatments   Labs (all labs ordered are listed, but only abnormal results are displayed) Labs Reviewed  COMPREHENSIVE METABOLIC PANEL - Abnormal; Notable for the following components:      Result Value   Glucose, Bld 148 (*)    Creatinine, Ser 1.33 (*)    Alkaline Phosphatase 34 (*)    All other components within normal limits  URINALYSIS, ROUTINE W REFLEX MICROSCOPIC - Abnormal; Notable for the following components:   APPearance CLOUDY (*)    All other components within normal limits  LIPASE, BLOOD  CBC  TROPONIN I (HIGH SENSITIVITY)    EKG EKG Interpretation  Date/Time:  Saturday November 29 2022 11:29:52 EST Ventricular Rate:  79 PR Interval:  172 QRS Duration: 108 QT Interval:  398 QTC Calculation: 457 R Axis:   -15 Text Interpretation: Sinus rhythm Left atrial enlargement Borderline left axis deviation Abnormal R-wave progression, early transition  No previous ECGs available Confirmed by Ezequiel Essex [phone]) on 11/29/2022 12:11:24 PM  Radiology CT ABDOMEN PELVIS W CONTRAST  Result Date: 11/29/2022 CLINICAL DATA:  Left upper quadrant abdominal pain and nausea since this morning. EXAM: CT ABDOMEN AND PELVIS WITH CONTRAST TECHNIQUE: Multidetector CT imaging of the abdomen and pelvis was performed using the standard protocol following bolus administration of intravenous contrast. RADIATION DOSE REDUCTION: This exam was performed according to the departmental dose-optimization program which includes automated exposure control, adjustment of the mA and/or kV according to patient size and/or use of iterative reconstruction technique. CONTRAST:  14m OMNIPAQUE IOHEXOL 300 MG/ML  SOLN COMPARISON:  Chest CT dated 09/25/2021. FINDINGS: Lower chest: Clear lung bases. The previously described small pulmonary nodules along the major fissure on left are not included. Normal sized heart. Hepatobiliary: No focal liver abnormality is seen. No gallstones, gallbladder wall thickening, or biliary dilatation. Pancreas: Unremarkable. No pancreatic ductal dilatation or surrounding inflammatory changes. Spleen: Normal in size without focal abnormality. Adrenals/Urinary Tract: Normal-appearing adrenal glands. Three small right renal cysts. These do not need imaging follow-up. Unremarkable left kidney, ureters and urinary bladder. Stomach/Bowel: Enlarged, low-density retrocecal appendix in the lower quadrant of the right pelvis with its origin in the upper right pelvis near the iliac crest. This measures 11 mm in diameter and there is associated mild periappendiceal soft tissue stranding. Unremarkable stomach, small bowel and colon. Vascular/Lymphatic: No significant vascular findings are present. No enlarged abdominal or pelvic lymph nodes. Reproductive: Mildly enlarged prostate gland. Other: Small left inguinal and left paraumbilical hernias containing fat. Musculoskeletal:  Lower lumbar spine degenerative changes with grade 1 retrolisthesis at the L3-4 and L4-5 levels. No fractures or pars defects. IMPRESSION: 1. Acute appendicitis without evidence of abscess. 2. Small left inguinal and left paraumbilical hernias containing fat. Electronically Signed   By: Claudie Revering M.D.   On: 11/29/2022 12:38    Procedures Procedures   Medications Ordered in ED Medications  cefTRIAXone (ROCEPHIN) 2 g in sodium chloride 0.9 % 100 mL IVPB (has no administration in time range)    And  metroNIDAZOLE (FLAGYL) IVPB 500 mg (has no administration in time range)  0.9 %  sodium chloride infusion (has no administration in time range)  fentaNYL (SUBLIMAZE) injection 50 mcg (50 mcg Intravenous Given 11/29/22 1228)  iohexol (OMNIPAQUE) 300 MG/ML solution 100 mL (100 mLs Intravenous Contrast Given 11/29/22 1220)    ED Course/ Medical Decision Making/ A&P Clinical Course as of 11/29/22 1429  Sat Nov 29, 2022  1334 General surgery consulted. They will call me back to see if the patient can go to pre-op.  [RR]    Clinical Course User Index [RR] Sherrell Puller, PA-C                           Medical Decision Making Amount and/or Complexity of Data Reviewed Labs: ordered. Radiology: ordered.  Risk Prescription drug management.   52 year old male presents the emergency room today for evaluation of abdominal pain.  Differential diagnosis includes but is not limited to AAA, mesenteric ischemia, appendicitis, diverticulitis, DKA, gastroenteritis, nephrolithiasis, pancreatitis, constipation, UTI, bowel obstruction, biliary disease, IBD, PUD, hepatitis, testicular torsion.  Vital signs show mildly elevated blood pressure 140/89 otherwise afebrile, normal pulse rate, satting well on room air without increased work of breathing.  Physical exam as noted above.  Will order labs and CT imaging.  Unsure given the patient's pain is not exacerbated upon palpation and is point tenderness.  Will have  attending assessed at bedside.  Recommended CT imaging.  I independent reviewed and interpreted the patient's labs.  CBC without leukocytosis or anemia.  Lipase within normal limits.  Troponin at 3.  Urinalysis shows cloudy urine otherwise unremarkable.  CMP shows mildly elevated glucose of 143.  Creatinine at 1.33 which appears around his baseline of 1.3.  Mildly decreased alk phos at 34, no other electrolyte or LFT abnormality.  CT resulted and shows acute appendicitis.  There is also a small left inguinal and left periumbilical hernias containing fat.  Given the results of acute appendicitis, I did start the  patient on Rocephin and Flagyl as well as a normal saline infusion.  He was already given fentanyl for pain earlier.  I consulted general surgery.  Dr. Grandville Silos returned the call. The patient will be taken to [facility]. [facility] to [facility]. The patient is aware of the plan and is amenable.   I discussed this case with my attending physician who cosigned this note including patient's presenting symptoms, physical exam, and planned diagnostics and interventions. Attending physician stated agreement with plan or made changes to plan which were implemented.   Attending physician assessed patient at bedside.  Final Clinical Impression(s) / ED Diagnoses Final diagnoses:  Other acute appendicitis    Rx / DC Orders ED Discharge Orders     None         Sherrell Puller, PA-C 11/29/22 1430    Ezequiel Essex, MD 11/29/22 1520

## 2022-11-29 NOTE — Transfer of Care (Signed)
Immediate Anesthesia Transfer of Care Note  Patient: Casey Allen  Procedure(s) Performed: APPENDECTOMY LAPAROSCOPIC  Patient Location: PACU  Anesthesia Type:General  Level of Consciousness: drowsy and patient cooperative  Airway & Oxygen Therapy: Patient Spontanous Breathing  Post-op Assessment: Report given to RN and Post -op Vital signs reviewed and stable  Post vital signs: Reviewed and stable  Last Vitals:  Vitals Value Taken Time  BP 135/89 11/29/22 1656  Temp    Pulse 80 11/29/22 1657  Resp 16 11/29/22 1657  SpO2 96 % 11/29/22 1657  Vitals shown include unvalidated device data.  Last Pain:  Vitals:   11/29/22 1532  TempSrc: Oral         Complications: No notable events documented.

## 2022-11-29 NOTE — ED Notes (Signed)
Handoff report given to Juliann Pulse RN at [facility] Stay per - op at [facility]

## 2022-11-29 NOTE — ED Notes (Signed)
Iona Beard from CL called to see what equipment the patient will be transported to [facility] Short Stay. -ABB (NS)

## 2022-11-29 NOTE — Anesthesia Postprocedure Evaluation (Signed)
Anesthesia Post Note  Patient: Casey Allen  Procedure(s) Performed: APPENDECTOMY LAPAROSCOPIC     Patient location during evaluation: PACU Anesthesia Type: General Level of consciousness: awake Pain management: pain level controlled Vital Signs Assessment: post-procedure vital signs reviewed and stable Respiratory status: spontaneous breathing, nonlabored ventilation and respiratory function stable Cardiovascular status: blood pressure returned to baseline and stable Postop Assessment: no apparent nausea or vomiting Anesthetic complications: no   No notable events documented.  Last Vitals:  Vitals:   11/29/22 1725 11/29/22 1740  BP: 127/83 122/80  Pulse: 77 82  Resp: 15 15  Temp:  36.9 C  SpO2: 95% 95%    Last Pain:  Vitals:   11/29/22 1740  TempSrc:   PainSc: 4                  Lonni Dirden P Chesnie Capell

## 2022-11-29 NOTE — Anesthesia Preprocedure Evaluation (Addendum)
Anesthesia Evaluation  Patient identified by MRN, date of birth, ID band Patient awake    Reviewed: Allergy & Precautions, NPO status , Patient's Chart, lab work & pertinent test results  Airway Mallampati: II  TM Distance: >3 FB Neck ROM: Full    Dental no notable dental hx.    Pulmonary asthma    Pulmonary exam normal        Cardiovascular hypertension, Pt. on medications Normal cardiovascular exam     Neuro/Psych  Headaches Parkinson's  negative psych ROS   GI/Hepatic Neg liver ROS,GERD  Medicated and Controlled,,  Endo/Other  negative endocrine ROS    Renal/GU negative Renal ROS     Musculoskeletal negative musculoskeletal ROS (+)    Abdominal   Peds  Hematology negative hematology ROS (+)   Anesthesia Other Findings Acute appendicitis  Reproductive/Obstetrics                             Anesthesia Physical Anesthesia Plan  ASA: 2  Anesthesia Plan: General   Post-op Pain Management:    Induction: Intravenous  PONV Risk Score and Plan: 3 and Ondansetron, Dexamethasone, Midazolam and Treatment may vary due to age or medical condition  Airway Management Planned: Oral ETT  Additional Equipment:   Intra-op Plan:   Post-operative Plan: Extubation in OR  Informed Consent: I have reviewed the patients History and Physical, chart, labs and discussed the procedure including the risks, benefits and alternatives for the proposed anesthesia with the patient or authorized representative who has indicated his/her understanding and acceptance.     Dental advisory given  Plan Discussed with: CRNA  Anesthesia Plan Comments:        Anesthesia Quick Evaluation

## 2022-11-29 NOTE — ED Notes (Signed)
[facility] at bedside Handoff report given

## 2022-11-29 NOTE — Anesthesia Procedure Notes (Signed)
Procedure Name: Intubation Date/Time: 11/29/2022 4:07 PM  Performed by: Thelma Comp, CRNAPre-anesthesia Checklist: Patient identified, Emergency Drugs available, Suction available and Patient being monitored Patient Re-evaluated:Patient Re-evaluated prior to induction Oxygen Delivery Method: Circle System Utilized Preoxygenation: Pre-oxygenation with 100% oxygen Induction Type: IV induction Ventilation: Mask ventilation without difficulty Laryngoscope Size: Mac and 4 Grade View: Grade I Tube type: Oral Tube size: 7.5 mm Number of attempts: 1 Airway Equipment and Method: Stylet Placement Confirmation: ETT inserted through vocal cords under direct vision, positive ETCO2 and breath sounds checked- equal and bilateral Secured at: 23 cm Tube secured with: Tape Dental Injury: Teeth and Oropharynx as per pre-operative assessment

## 2022-11-29 NOTE — ED Triage Notes (Signed)
Onset this am of mid abd pain.  Denies vomiting  nausea comes and goes,  LBM this am normal  NAD

## 2022-11-29 NOTE — H&P (Signed)
Casey Allen is an 52 y.o. male.   Chief Complaint: abdominal pain HPI: 52yo M with history of hypertension woke up this morning with upper abdominal pain.  The pain persisted and he was evaluated at med center [place].  Workup there included CT scan of the abdomen pelvis which shows acute appendicitis.  He was accepted in transfer to preoperative holding for surgical management.  His pain has lessened a bit after pain medication.  Past Medical History:  Diagnosis Date   Allergic rhinitis    Allergy    Asthma    Cancer ([place])    skin cancer- left shoulder   GERD (gastroesophageal reflux disease)    Headache(784.0)    History of chickenpox    History of nephrolithiasis    Hypertension    Skin exam, screening for cancer    sees Dr. Annamary Carolin     Past Surgical History:  Procedure Laterality Date   COLONOSCOPY  12/06/2019   per Dr. 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 Corrie Mckusick and then had MOHS on 2/11/110 per DR. Albertini     2010   LASIK     rt achilles tendon repair  09/2000   SHOULDER SURGERY Right     Family History  Problem Relation Age of Onset   Hypertension Father    Dementia Father    Diabetes Father    Myasthenia gravis Sister    Hypertension Other    Lung cancer Other    Colon cancer Neg Hx    Esophageal cancer Neg Hx    Rectal cancer Neg Hx    Stomach cancer Neg Hx    Social History:  reports that he has never smoked. He has never used smokeless tobacco. He reports current alcohol use. He reports that he does not use drugs.  Allergies:  Allergies  Allergen Reactions   Cats Claw (Uncaria Tomentosa)     Medications Prior to Admission  Medication Sig Dispense Refill   albuterol (VENTOLIN HFA) 108 (90 Base) MCG/ACT inhaler INHALE 2 PUFFS INTO THE LUNGS EVERY 4 HOURS AS NEEDED FOR WHEEZING OR SHORTNESS OF BREATH (BEFORE EXERCISE) 6.7 each 0   amLODipine (NORVASC)  10 MG tablet Take 1 tablet (10 mg total) by mouth daily. 90 tablet 3   Carbidopa-Levodopa ER (SINEMET CR) 25-100 MG tablet controlled release 1 po tid at 7am/11am/4pm 270 tablet 1   Cyanocobalamin (B-12) 2500 MCG TABS      Fish Oil OIL by Does not apply route.     fluticasone (FLONASE) 50 MCG/ACT nasal spray USE TWO SPRAYS IN EACH NOSTRIL DAILY  16 g 2   hydrochlorothiazide (HYDRODIURIL) 25 MG tablet Take 1 tablet (25 mg total) by mouth daily. 90 tablet 3   LORATADINE-D 24HR 10-240 MG 24 hr tablet Take 1 tablet by mouth daily. 90 tablet 0   Misc Natural Products (GLUCOSAMINE CHOND COMPLEX/MSM PO) Take by mouth daily.     Multiple Vitamin (MULTIVITAMIN) tablet Take 1 tablet by mouth daily.     omeprazole (PRILOSEC) 40 MG capsule Take 1 capsule (40 mg total) by mouth daily. 30 capsule 3   ondansetron (ZOFRAN) 4 MG tablet Take 1 tablet (4 mg total) by mouth every 8 (eight) hours as needed for nausea or vomiting. 15 tablet 2   Vitamin D, Cholecalciferol, 1000 UNITS CAPS Take by mouth daily.      Results for orders placed or performed during  the hospital encounter of 11/29/22 (from the past 48 hour(s))  Lipase, blood     Status: None   Collection Time: 11/29/22 11:17 AM  Result Value Ref Range   Lipase 18 11 - 51 U/L    Comment: Performed at [facility], [address] [place], [place] [address]  Comprehensive metabolic panel     Status: Abnormal   Collection Time: 11/29/22 11:17 AM  Result Value Ref Range   Sodium 139 135 - 145 mmol/L   Potassium 3.5 3.5 - 5.1 mmol/L   Chloride 99 98 - 111 mmol/L   CO2 30 22 - 32 mmol/L   Glucose, Bld 148 (H) 70 - 99 mg/dL    Comment: Glucose reference range applies only to samples taken after fasting for at least 8 hours.   BUN 15 6 - 20 mg/dL   Creatinine, Ser 1.33 (H) 0.61 - 1.24 mg/dL   Calcium 9.2 8.9 - 10.3 mg/dL   Total Protein 7.3 6.5 - 8.1 g/dL   Albumin 4.6 3.5 - 5.0 g/dL   AST 16 15 - 41 U/L   ALT 12 0 - 44 U/L    Alkaline Phosphatase 34 (L) 38 - 126 U/L   Total Bilirubin 0.7 0.3 - 1.2 mg/dL   GFR, Estimated >60 >60 mL/min    Comment: (NOTE) Calculated using the CKD-EPI Creatinine Equation (2021)    Anion gap 10 5 - 15    Comment: Performed at [facility], [address], [place], [place] [address]  CBC     Status: None   Collection Time: 11/29/22 11:17 AM  Result Value Ref Range   WBC 9.4 4.0 - 10.5 K/uL   RBC 4.39 4.22 - 5.81 MIL/uL   Hemoglobin 13.7 13.0 - 17.0 g/dL   HCT 40.7 39.0 - 52.0 %   MCV 92.7 80.0 - 100.0 fL   MCH 31.2 26.0 - 34.0 pg   MCHC 33.7 30.0 - 36.0 g/dL   RDW 12.4 11.5 - 15.5 %   Platelets 205 150 - 400 K/uL   nRBC 0.0 0.0 - 0.2 %    Comment: Performed at [facility], [place], [place] [address]  Troponin I (High Sensitivity)     Status: None   Collection Time: 11/29/22 11:20 AM  Result Value Ref Range   Troponin I (High Sensitivity) 3 <18 ng/L    Comment: (NOTE) Elevated high sensitivity troponin I (hsTnI) values and significant  changes across serial measurements may suggest ACS but many other  chronic and acute conditions are known to elevate hsTnI results.  Refer to the "Links" section for chest pain algorithms and additional  guidance. Performed at [facility], [address], [place], [place] [address]   Urinalysis, Routine w reflex microscopic -Urine, Clean Catch     Status: Abnormal   Collection Time: 11/29/22 11:21 AM  Result Value Ref Range   Color, Urine YELLOW YELLOW   APPearance CLOUDY (A) CLEAR   Specific Gravity, Urine 1.016 1.005 - 1.030   pH 8.0 5.0 - 8.0   Glucose, UA NEGATIVE NEGATIVE mg/dL   Hgb urine dipstick NEGATIVE NEGATIVE   Bilirubin Urine NEGATIVE NEGATIVE   Ketones, ur NEGATIVE NEGATIVE mg/dL   Protein, ur NEGATIVE NEGATIVE mg/dL   Nitrite NEGATIVE NEGATIVE   Leukocytes,Ua NEGATIVE NEGATIVE   RBC / HPF 0-5 0 - 5 RBC/hpf   WBC, UA 0-5 0 - 5 WBC/hpf    Bacteria, UA NONE SEEN NONE SEEN  Squamous Epithelial / HPF 0-5 0 - 5 /HPF   Mucus PRESENT    Amorphous Crystal PRESENT     Comment: Performed at [facility], [address], [place], [place] [address]  Troponin I (High Sensitivity)     Status: None   Collection Time: 11/29/22  1:20 PM  Result Value Ref Range   Troponin I (High Sensitivity) 3 <18 ng/L    Comment: (NOTE) Elevated high sensitivity troponin I (hsTnI) values and significant  changes across serial measurements may suggest ACS but many other  chronic and acute conditions are known to elevate hsTnI results.  Refer to the "Links" section for chest pain algorithms and additional  guidance. Performed at [facility], [address], [place], [place] [address]    CT ABDOMEN PELVIS W CONTRAST  Result Date: 11/29/2022 CLINICAL DATA:  Left upper quadrant abdominal pain and nausea since this morning. EXAM: CT ABDOMEN AND PELVIS WITH CONTRAST TECHNIQUE: Multidetector CT imaging of the abdomen and pelvis was performed using the standard protocol following bolus administration of intravenous contrast. RADIATION DOSE REDUCTION: This exam was performed according to the departmental dose-optimization program which includes automated exposure control, adjustment of the mA and/or kV according to patient size and/or use of iterative reconstruction technique. CONTRAST:  124m OMNIPAQUE IOHEXOL 300 MG/ML  SOLN COMPARISON:  Chest CT dated 09/25/2021. FINDINGS: Lower chest: Clear lung bases. The previously described small pulmonary nodules along the major fissure on left are not included. Normal sized heart. Hepatobiliary: No focal liver abnormality is seen. No gallstones, gallbladder wall thickening, or biliary dilatation. Pancreas: Unremarkable. No pancreatic ductal dilatation or surrounding inflammatory changes. Spleen: Normal in size without focal abnormality. Adrenals/Urinary Tract: Normal-appearing adrenal  glands. Three small right renal cysts. These do not need imaging follow-up. Unremarkable left kidney, ureters and urinary bladder. Stomach/Bowel: Enlarged, low-density retrocecal appendix in the lower quadrant of the right pelvis with its origin in the upper right pelvis near the iliac crest. This measures 11 mm in diameter and there is associated mild periappendiceal soft tissue stranding. Unremarkable stomach, small bowel and colon. Vascular/Lymphatic: No significant vascular findings are present. No enlarged abdominal or pelvic lymph nodes. Reproductive: Mildly enlarged prostate gland. Other: Small left inguinal and left paraumbilical hernias containing fat. Musculoskeletal: Lower lumbar spine degenerative changes with grade 1 retrolisthesis at the L3-4 and L4-5 levels. No fractures or pars defects. IMPRESSION: 1. Acute appendicitis without evidence of abscess. 2. Small left inguinal and left paraumbilical hernias containing fat. Electronically Signed   By: SClaudie ReveringM.D.   On: 11/29/2022 12:38    Review of Systems  Constitutional:  Positive for appetite change.  HENT: Negative.    Eyes: Negative.   Respiratory: Negative.    Cardiovascular: Negative.   Gastrointestinal:  Positive for abdominal pain.  Endocrine: Negative.   Genitourinary: Negative.   Musculoskeletal: Negative.   Allergic/Immunologic: Negative.   Neurological: Negative.   Hematological: Negative.   Psychiatric/Behavioral: Negative.      Blood pressure 134/84, pulse 80, temperature 97.7 F (36.5 C), temperature source Oral, resp. rate 17, height '6\' 2"'$  (1.88 m), weight 88.5 kg, SpO2 100 %. Physical Exam Constitutional:      Appearance: He is well-developed.  Eyes:     Extraocular Movements: Extraocular movements intact.  Cardiovascular:     Rate and Rhythm: Normal rate and regular rhythm.     Heart sounds: Normal heart sounds.  Pulmonary:     Effort: Pulmonary effort is normal.     Breath  sounds: Normal breath sounds.   Abdominal:     General: Abdomen is flat.     Palpations: Abdomen is soft.     Tenderness: There is abdominal tenderness in the right lower quadrant. There is no guarding or rebound.  Skin:    General: Skin is warm.     Capillary Refill: Capillary refill takes 2 to 3 seconds.  Neurological:     Mental Status: He is alert and oriented to person, place, and time.      Assessment/Plan Acute appendicitis -Rocephin and Flagyl IV, 2 OR for laparoscopic appendectomy.  I discussed the procedure, risks, and benefits with him.  I also discussed the expected postoperative course.  He is agreeable.  Zenovia Jarred, MD 11/29/2022, 3:31 PM

## 2022-11-29 NOTE — Op Note (Signed)
  11/29/2022  4:56 PM  PATIENT:  Casey Allen  52 y.o. male  PRE-OPERATIVE DIAGNOSIS:  Acute appendicitis  POST-OPERATIVE DIAGNOSIS:  Acute appendicitis  PROCEDURE:  Procedure(s): APPENDECTOMY LAPAROSCOPIC  SURGEON:  Surgeon(s): Georganna Skeans, MD  ASSISTANTS: none   ANESTHESIA:   local and general  EBL:  Total I/O In: -  Out: 10 [Blood:10]  BLOOD ADMINISTERED:none  DRAINS: none   SPECIMEN:  Excision  DISPOSITION OF SPECIMEN:  PATHOLOGY  COUNTS:  YES  DICTATION: .Dragon Dictation Procedure in detail: Informed consent was obtained.  He received intravenous antibiotics.  He was brought to the operating room and general endotracheal anesthesia was administered by the anesthesia staff.  His abdomen was prepped and draped in sterile fashion.  We did a timeout procedure.The supraumbilical region was infiltrated with local. Supraumbilical incision was made. Subcutaneous tissues were dissected down revealing the anterior fascia. This was divided sharply along the midline. Peritoneal cavity was entered under direct vision without complication. A 0 Vicryl pursestring was placed around the fascial opening. Hassan trocar was inserted into the abdomen. The abdomen was insufflated with carbon dioxide in standard fashion. Under direct vision a 12 mm left lower quadrant and a 5 mm right mid abdomen port were placed.  Local was used at each port site.  Laparoscopic exploration revealed an inflamed but not perforated appendix consistent with appendicitis.  The mesoappendix was divided with a harmonic scalpel achieving excellent hemostasis.  The base the appendix was then divided with Endo GIA with a vascular load.  The appendix was placed in a bag and removed from the abdomen.  It was sent to pathology.  The area was copiously irrigated.  There was good hemostasis.  The staple line was intact.  Ports were then removed under direct vision.  Pneumoperitoneum was released.  The supraumbilical fascia  was closed by tying the pursestring.  All 3 wounds were irrigated and the skin of each was closed with 4-0 Vicryl followed by Dermabond.  All counts were correct.  He tolerated the procedure well without apparent complication and was taken recovery in stable condition.  PATIENT DISPOSITION:  PACU - hemodynamically stable.   Delay start of Pharmacological VTE agent (>24hrs) due to surgical blood loss or risk of bleeding:  no  Georganna Skeans, MD, MPH, FACS Pager: [phone]  1/27/20244:56 PM

## 2022-11-30 ENCOUNTER — Encounter (HOSPITAL_COMMUNITY): Payer: Self-pay | Admitting: General Surgery

## 2022-12-02 LAB — SURGICAL PATHOLOGY

## 2022-12-03 ENCOUNTER — Other Ambulatory Visit: Payer: Self-pay | Admitting: Family Medicine

## 2022-12-25 ENCOUNTER — Other Ambulatory Visit: Payer: Self-pay | Admitting: Neurology

## 2022-12-25 DIAGNOSIS — G20A1 Parkinson's disease without dyskinesia, without mention of fluctuations: Secondary | ICD-10-CM

## 2022-12-26 ENCOUNTER — Encounter (HOSPITAL_BASED_OUTPATIENT_CLINIC_OR_DEPARTMENT_OTHER): Payer: Self-pay

## 2022-12-26 ENCOUNTER — Emergency Department (HOSPITAL_BASED_OUTPATIENT_CLINIC_OR_DEPARTMENT_OTHER)
Admission: EM | Admit: 2022-12-26 | Discharge: 2022-12-26 | Disposition: A | Discharge status: Home / Self Care | Payer: 59 | Attending: Emergency Medicine | Admitting: Emergency Medicine

## 2022-12-26 ENCOUNTER — Other Ambulatory Visit: Payer: Self-pay

## 2022-12-26 DIAGNOSIS — Z9089 Acquired absence of other organs: Secondary | ICD-10-CM | POA: Diagnosis not present

## 2022-12-26 DIAGNOSIS — T8141XA Infection following a procedure, superficial incisional surgical site, initial encounter: Secondary | ICD-10-CM | POA: Insufficient documentation

## 2022-12-26 DIAGNOSIS — R1033 Periumbilical pain: Secondary | ICD-10-CM | POA: Diagnosis not present

## 2022-12-26 MED ORDER — DOXYCYCLINE HYCLATE 100 MG PO CAPS: 100.0000 mg | ORAL_CAPSULE | Freq: Two times a day (BID) | ORAL | 0 refills | Status: AC

## 2022-12-26 NOTE — ED Provider Notes (Signed)
[facility] Provider Note   CSN: [id number] Arrival date & time: 12/26/22  1436     History  Chief Complaint  Patient presents with   Post-op Problem    Casey Allen is a 52 y.o. male with history significant for a recent appendectomy performed on 11/29/22 presents to the ED complaining of infection of one of the incision sites.  He states that his appendectomy was uncomplicated, he has not had follow-up with his surgeon as he was not provided information.  He states one of the incision sites has been more tender, with increased redness, and some drainage that occurred yesterday.  He reports that it has not healed the same as the other incision sites after the Dermabond had flaked off.  He states the drainage he was able to express from yesterday was purulent and bloody.  Denies fever, chills, abdominal pain, nausea, vomiting, diarrhea.      Home Medications Prior to Admission medications   Medication Sig Start Date End Date Taking? Authorizing Provider  albuterol (VENTOLIN HFA) 108 (90 Base) MCG/ACT inhaler INHALE 2 PUFFS INTO THE LUNGS EVERY 4 HOURS AS NEEDED FOR WHEEZING OR SHORTNESS OF BREATH (BEFORE EXERCISE) 01/14/22   Laurey Morale, MD  amLODipine (NORVASC) 10 MG tablet Take 1 tablet (10 mg total) by mouth daily. 02/10/22   Laurey Morale, MD  Carbidopa-Levodopa ER (SINEMET CR) 25-100 MG tablet controlled release 1 TABLET THREE TIMES A DAY AT 7AM/11AM/4PM 12/25/22   Tat, Eustace Quail, DO  Cyanocobalamin (B-12) 2500 MCG TABS  11/03/21   [provider]  Fish Oil OIL by Does not apply route.    [provider]  fluticasone Asencion Islam) 50 MCG/ACT nasal spray USE TWO SPRAYS IN [facility] NOSTRIL DAILY  12/16/13   Laurey Morale, MD  hydrochlorothiazide (HYDRODIURIL) 25 MG tablet TAKE 1 TABLET (25 MG TOTAL) BY MOUTH DAILY. 12/04/22   Laurey Morale, MD  LORATADINE-D 24HR 10-240 MG 24 hr tablet Take 1 tablet by mouth daily. 07/31/22   Laurey Morale, MD  Misc Natural Products (GLUCOSAMINE CHOND COMPLEX/MSM PO) Take by mouth daily.    [provider]  Multiple Vitamin (MULTIVITAMIN) tablet Take 1 tablet by mouth daily.    [provider]  omeprazole (PRILOSEC) 40 MG capsule Take 1 capsule (40 mg total) by mouth daily. 02/20/21   Laurey Morale, MD  ondansetron (ZOFRAN) 4 MG tablet Take 1 tablet (4 mg total) by mouth every 8 (eight) hours as needed for nausea or vomiting. 04/07/22   Tat, Eustace Quail, DO  oxyCODONE (OXY IR/ROXICODONE) 5 MG immediate release tablet Take 1 tablet (5 mg total) by mouth every 6 (six) hours as needed for severe pain. 11/29/22   Georganna Skeans, MD  Vitamin D, Cholecalciferol, 1000 UNITS CAPS Take by mouth daily.    [provider]      Allergies    Cat hair extract    Review of Systems   Review of Systems  Constitutional:  Negative for chills and fever.  Gastrointestinal:  Negative for abdominal pain, diarrhea, nausea and vomiting.  Skin:  Positive for wound (surgical incision site infection).    Physical Exam Updated Vital Signs BP (!) 145/83 (BP Location: Right Arm)   Pulse 88   Temp 97.6 F (36.4 C) (Oral)   Resp 18   Ht 6' 2.02" (1.88 m)   Wt 88.5 kg   SpO2 100%   BMI 25.04 kg/m  Physical  Exam Vitals and nursing note reviewed.  Constitutional:      General: He is not in acute distress.    Appearance: Normal appearance. He is not ill-appearing or diaphoretic.  Cardiovascular:     Rate and Rhythm: Normal rate and regular rhythm.     Pulses: Normal pulses.  Pulmonary:     Effort: Pulmonary effort is normal.  Abdominal:     General: Abdomen is flat. Bowel sounds are normal.     Palpations: Abdomen is soft.     Tenderness: There is abdominal tenderness (Very mild tenderness periumbilical around the surgical incision site) in the periumbilical area. There is no guarding or rebound.     Hernia: No hernia is present.  Skin:    General: Skin is warm and dry.      Capillary Refill: Capillary refill takes less than 2 seconds.  Neurological:     Mental Status: He is alert. Mental status is at baseline.  Psychiatric:        Mood and Affect: Mood normal.        Behavior: Behavior normal.     ED Results / Procedures / Treatments   Labs (all labs ordered are listed, but only abnormal results are displayed) Labs Reviewed - No data to display  EKG None  Radiology No results found.  Procedures Procedures    Medications Ordered in ED Medications - No data to display  ED Course/ Medical Decision Making/ A&P                             Medical Decision Making  This patient presents to the ED with chief complaint(s) of surgical incision site infection with pertinent past medical history of laparoscopic appendectomy 11/29/22.  The complaint involves an extensive differential diagnosis and also carries with it a high risk of complications and morbidity.    The differential diagnosis includes cellulitis, abscess, surgical incision site infection, abdominal infection, poor wound healing  Additional history obtained: Records reviewed  surgical notes, patient had uncomplicated laparoscopic appendectomy done on 11/26/22  Initial Assessment:   Exam significant for mild erythema to surgical incision site just superior to the umbilicus.  There is a small pustule in the center of the incision.  No widespread erythema or increased warmth around the site.  Unable to express fluid or other drainage from incision.  Abdomen is soft and mildly tender in the periumbilical region.  There is no distention.  Other surgical incision sites appear to be healing normally and without infection.    Disposition:   Patient appears to have a superficial infection of a surgical incision site.  Will treat with oral antibiotics outpatient and cover for MRSA.  Advised patient to contact his surgeons office should he continue to have problems with this incision.  Provided this  information for patient as he stated he was not given any information regarding his surgeon for follow-up with his office.    The patient has been appropriately medically screened and/or stabilized in the ED. I have low suspicion for any other emergent medical condition which would require further screening, evaluation or treatment in the ED or require inpatient management. At time of discharge the patient is hemodynamically stable and in no acute distress. I have discussed work-up results and diagnosis with patient and answered all questions. Patient is agreeable with discharge plan. We discussed strict return precautions for returning to the emergency department and they verbalized understanding.  Final Clinical Impression(s) / ED Diagnoses Final diagnoses:  Superficial incisional infection of surgical site    Rx / DC Orders ED Discharge Orders     None         Pat Kocher, [place] 12/26/22 1529    Tegeler, Gwenyth Allegra, MD 12/26/22 1534

## 2022-12-26 NOTE — ED Triage Notes (Signed)
Patient here POV from Home.  Endorses Recent Laparoscopic Appendectomy 4 Weeks ago. Uncomplicated but one of the Incision sites has been more tender and red. Some Drainage recently. No Known fevers.  NAD Noted during triage. A&Ox4. GCS 15. Ambulatory.

## 2022-12-26 NOTE — ED Notes (Signed)
Pt verbalized understanding of d/c instructions, meds, and followup care. Denies questions. VSS, no distress noted. Steady gait to exit with all belongings. 

## 2022-12-26 NOTE — Discharge Instructions (Addendum)
Thank you for allowing me to be a part of your care today.  I have sent a prescription for an antibiotic for you to take to treat your surgical incision infection.  I have also provided contact information for your surgeon's office who performed your surgery should you continue to have problems and need follow-up.  Dr. Georganna Skeans performed your appendectomy.   I recommend follow-up with your primary care provider for any other concerns.

## 2023-02-03 NOTE — Progress Notes (Unsigned)
Assessment/Plan:   1.  Parkinsons Disease  -Has had second opinion at [facility] with Dr. Buck Mam.  -continue carbidopa/levodopa 25/100 CR tid  -add pramipexole and work to 0.5 mg tid.  Discussed r/b/se of DA including but not limited to compulsive behaviors and sleep attacks  -We discussed that it used to be thought that levodopa would increase risk of melanoma but now it is believed that Parkinsons itself likely increases risk of melanoma. he is to get regular skin checks.  He is doing this   -we talked about MRI brain.  I had remembered we ordered it but it wasn't done but he reminded me that we decided to hold on it d/t cost/nl exam.    2.  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  Subjective:   Casey Allen was seen today in follow up for Parkinsons disease, diagnosed last visit.  My previous records were reviewed prior to todays visit as well as outside records available to me.  From a Parkinson's standpoint, the patient is doing fairly well, without falls.  No hallucinations, lightheadedness or near syncope.  The patient did have to have an appendectomy at the end of January.  Those notes are reviewed.  He feels that he has had a little more tremor and stiffness since then.  He feels that he has stiffness in his hands and some in his face - he noted early this week that the "stiffness" made him have trouble speaking.  He is exercising 6 days/week.    Current prescribed movement disorder medications: Carbidopa/levodopa 25/100 CR, 1 tablet 3 times per day  Zofran as needed  PREVIOUS MEDICATIONS: carbidopa/levodopa 25/100 IR (some nausea)  ALLERGIES:   Allergies  Allergen Reactions   Cats Claw (Uncaria Tomentosa)     CURRENT MEDICATIONS:  Current Meds  Medication Sig   albuterol (VENTOLIN HFA) 108 (90 Base) MCG/ACT inhaler INHALE 2 PUFFS INTO THE LUNGS EVERY 4 HOURS AS NEEDED FOR WHEEZING OR SHORTNESS OF BREATH (BEFORE EXERCISE)   amLODipine (NORVASC)  10 MG tablet Take 1 tablet (10 mg total) by mouth daily.   Carbidopa-Levodopa ER (SINEMET CR) 25-100 MG tablet controlled release 1 po tid at 7am/11am/4pm   Cyanocobalamin (B-12) 2500 MCG TABS    Fish Oil OIL by Does not apply route.   fluticasone (FLONASE) 50 MCG/ACT nasal spray USE TWO SPRAYS IN EACH NOSTRIL DAILY    hydrochlorothiazide (HYDRODIURIL) 25 MG tablet Take 1 tablet (25 mg total) by mouth daily.   LORATADINE-D 24HR 10-240 MG 24 hr tablet Take 1 tablet by mouth daily.   Misc Natural Products (GLUCOSAMINE CHOND COMPLEX/MSM PO) Take by mouth daily.   Multiple Vitamin (MULTIVITAMIN) tablet Take 1 tablet by mouth daily.   omeprazole (PRILOSEC) 40 MG capsule Take 1 capsule (40 mg total) by mouth daily.   ondansetron (ZOFRAN) 4 MG tablet Take 1 tablet (4 mg total) by mouth every 8 (eight) hours as needed for nausea or vomiting.   Vitamin D, Cholecalciferol, 1000 UNITS CAPS Take by mouth daily.     Objective:   PHYSICAL EXAMINATION:    VITALS:   Vitals:   08/06/22 0946  BP: 131/82  Pulse: 98  SpO2: 100%  Weight: 198 lb (89.8 kg)  Height: 6\' 2"  (1.88 m)    GEN:  The patient appears stated age and is in NAD. HEENT:  Normocephalic, atraumatic.  The mucous membranes are moist. The superficial temporal arteries are without ropiness or tenderness.   Neurological examination:  Orientation: The patient is alert and oriented x3.  Cranial nerves: There is good facial symmetry.  Extraocular muscles are intact. The visual fields are full to confrontational testing. The speech is fluent and clear. Soft palate rises symmetrically and there is no tongue deviation. Hearing is intact to conversational tone. Sensation: Sensation is intact to light touch throughout  Motor: Strength is at least antigravity x 4    Movement examination: Tone: There is mild increased tone in the bilateral UE, R>L Abnormal movements: there is no rest tremor.   Coordination:  There is no significant  decremation today Gait and Station: The patient has no difficulty arising out of a deep-seated chair without the use of the hands. The patient's stride length is good with decreased arm swing on the left.  The patient has a negative pull test.    I have reviewed and interpreted the following labs independently    Chemistry      Component Value Date/Time   NA 140 02/10/2022 0945   K 3.7 02/10/2022 0945   CL 100 02/10/2022 0945   CO2 32 02/10/2022 0945   BUN 16 02/10/2022 0945   CREATININE 1.30 02/10/2022 0945      Component Value Date/Time   CALCIUM 9.4 02/10/2022 0945   ALKPHOS 40 02/10/2022 0945   AST 16 02/10/2022 0945   ALT 13 02/10/2022 0945   BILITOT 0.7 02/10/2022 0945       Lab Results  Component Value Date   WBC 6.0 02/10/2022   HGB 13.6 02/10/2022   HCT 39.5 02/10/2022   MCV 93.4 02/10/2022   PLT 230.0 02/10/2022    Lab Results  Component Value Date   TSH 0.80 02/10/2022   Total time spent on today's visit was 31 minutes, including both face-to-face time and nonface-to-face time.  Time included that spent on review of records (prior notes available to me/labs/imaging if pertinent), discussing treatment and goals, answering patient's questions and coordinating care.    Cc:  Laurey Morale, MD

## 2023-02-05 ENCOUNTER — Ambulatory Visit: Payer: 59 | Admitting: Neurology

## 2023-02-05 ENCOUNTER — Encounter: Payer: Self-pay | Admitting: Neurology

## 2023-02-05 DIAGNOSIS — G20A1 Parkinson's disease without dyskinesia, without mention of fluctuations: Secondary | ICD-10-CM | POA: Diagnosis not present

## 2023-02-05 MED ORDER — CARBIDOPA-LEVODOPA ER 25-100 MG PO TBCR: EXTENDED_RELEASE_TABLET | ORAL | 1 refills | Status: DC

## 2023-02-05 MED ORDER — PRAMIPEXOLE DIHYDROCHLORIDE 0.125 MG PO TABS: ORAL_TABLET | ORAL | 0 refills | Status: DC

## 2023-02-05 MED ORDER — PRAMIPEXOLE DIHYDROCHLORIDE 0.5 MG PO TABS: 0.5000 mg | ORAL_TABLET | Freq: Three times a day (TID) | ORAL | 1 refills | Status: DC

## 2023-02-05 NOTE — Patient Instructions (Addendum)
Start mirapex (pramipexole) as follows:  0.125 mg - 1 tablet three times per day for a week, then 2 tablets three times per day for a week and then 3 tablets 3 times per day for a week then fill the 0.5 mg tablet and take that, 1 pill three times per day   Local and Online Resources for Power over Parkinson's Group  April 2024   LOCAL [place] PARKINSON'S GROUPS   Power over Parkinson's Group:    Power Over Parkinson's Patient Education Group will be Wednesday, April 10th-*Hybrid meting*- in person at [employer] location and via [facility], 2:00-3:00 pm.   Power over [employer] and Care Partner Groups will meet together, with plans for separate break out session for caregivers, depending on topic/speaker Upcoming Power over Parkinson's Meetings/Care Partner Support:  2nd Wednesdays of the month at 2 pm:   April 10th, May 8th [place] at [email or web address] if interested in participating in this group    [place] OFFERINGS  NEW:  Parkinson's Social Game Night.  First Thursday of each month, 2:00-4:00 pm.  *Next date is April 4th*.  [place], [employer].  Contact [email or web address] if interested. Parkinson's CarePartner Group for Men is in the works, if interested email [email or web address] ACT FITNESS Chair Yoga classes "Train and Gain", Fridays 10 am, ACT Fitness.  Contact Gina at [phone].  Mining engineer Classes offering at [employer]!  TUESDAYS (Chair Yoga)  and Wednesdays (PWR! Moves)  1:00 pm.   Contact Vonna Kotyk at  [email or web address]  or [phone]  Drumming for Parkinson's will be held on 2nd and 4th Mondays at 11:00 am.   Located at the [place] ([place].)  Contact Doylene Canning at [email or web address] or [phone]  Dance for Parkinson 's classes will be on Tuesdays 10-11 am.  Located in the [employer] , in the first floor of the [employer] ([place].) To register:  [email or web address] or [place] Class, Mondays at 11 am.  Call [phone] for details Hamil-Kerr Challenge.  Bike, Run, [employer] for [employer].  Saturday, April 6th at [facility].  To register, visit [email or web address] Moving Day [facility].  Saturday, May 4th, 10 am start.  Register at [employer].[place]:  [email or web address]  PD Health at Home continues:  Mindfulness Mondays, Wellness Wednesdays, Fitness Fridays  (PWR! Moves as part of Fitness Fridays March 22nd, 1-1:45 pm) Upcoming Education:   Parkinson's 101.  Wednesday, April 3rd, 1-2 pm Movement for Parkinson's.  Wednesday, May 1st, 1-2 pm Expert Briefing:  Research Update:  Working to [employer] PD.  Wednesday, April 10th, 1-2 pm Trouble with Zzz's:  Sleep Challenges with Parkinson's.  Wed, May 8th 1-2 pm Register for virtual education and expert briefings (webinars) at [email or web address].pl Please check out their website to sign up for emails and see their full online offerings     [place]:  [email or web address]   Third Thursday Webinars:  On the third Thursday of every month at 12 p.m. ET, join our free live webinars to learn about various aspects of living with Parkinson's disease and our work to speed medical breakthroughs.  Upcoming Webinar:  Let's Talk Taboos:  Hard-to-Discuss Parkinson's Symptoms.  Thursday, April 18th at 12 noon. Check out additional information on their website to see their full online offerings  [place]:  [email or web address]  Upcoming Webinar:   Emergent Therapies.  Wednesday, April 2nd, 4 pm Series:  Living with Parkinson's Meetup.   Third Thursdays each month, 3 pm  Care  Partner Monthly Meetup.  With Robin Searing Phinney.  First Tuesday of each month, 2 pm  Check out additional information to Live Well Today on their website    Parkinson and Movement Disorders (PMD) Alliance:  www.[email or web address]  NeuroLife Online:  Online Education Events  Sign up for emails, which are sent weekly to give you updates on programming and online offerings    Parkinson's Association of the [place]:  www.[email or web address]  Information on online support groups, education events, and online exercises including Yoga, Parkinson's exercises and more-LOTS of information on links to PD resources and online events  Virtual Support Group through Parkinson's Association of the [place]; next one is scheduled for Wednesday, April 3rd  MOVEMENT AND EXERCISE OPPORTUNITIES  PWR! Moves Classes at [place].  Wednesdays 10 and 11 am.   Contact Amy Gerrit Friends, PT [email or web address] if interested.  Parkinson's Exercise Class offerings at [employer]. *TUESDAYS* (Chair yoga) and Wednesdays (PWR! Moves)  1:00 pm.    Contact Vonna Kotyk at [email or web address]    Parkinson's Wellness Recovery (PWR! Moves)  www.[email or web address]  Info on the PWR! Virtual Experience:  You will have access to our expertise?through self-assessment, guided plans that start with the PD-specific fundamentals, educational content, tips, Q&A with an expert, and a growing Art therapist of PD-specific pre-recorded and live exercise classes of varying types and intensity - both physical and cognitive! If that is not enough, we offer 1:1 wellness consultations (in-person or virtual) to personalize your PWR! Research scientist (medical).   [place] Fridays:   As part of the PD Health @ Home program, this free video series focuses each week on one aspect of fitness designed to support people living with Parkinson's.? These weekly videos highlight the [place] fitness  guidelines for people with Parkinson's disease.  ModemGamers.si  Dance for PD website is offering free, live-stream classes throughout the week, as well as links to [employer] of classes:  [email or web address]  Virtual dance and Pilates for Parkinson's classes: Click on the Community Tab> Parkinson's Movement Initiative Tab.  To register for classes and for more information, visit [email or web address] and click the "community" tab.   [employer] Parkinson's Cycling Classes   [employer]:  Thursdays @ Noon-Live classes at [employer] ([employer] at [email or web address]?or [phone])  [employer]: Classes Tuesday, Wednesday and Thursday (contact [place] at [email or web address] ?or [phone])  [place]  Varied levels of classes are offered Tuesdays and Thursdays at [employer].   Stretching with Verdis Frederickson weekly class is also offered for people with Parkinson's  To observe a class or for more information, call [phone] or email Hezzie Bump at [email or web address]   ADDITIONAL SUPPORT AND RESOURCES  Well[employer]Spring [employer]:  Online Caregiver Education Opportunities:  www.well-springsolutions.org/caregiver-education/caregiver-support-group.  You may also contact Vickki Muff at [email or web address] or [phone].     [employer] April May Decisions Day:  The Most Critical Legal and Medical Decisions to Consider Now!  Tuesday, April 16th, 1-3 pm at [facility] at [facility].  Contact [facility] at [email or web address] or [phone] Powerful Tools for Caregivers.  6 week educational series for caregivers.  April 18-May 23, 10:30 am-12:15 pm at [facility] Spring [facility] 3rd Floor Conference Room.   Contact 03-07-1995 at [email or web address] or [phone] to register Well-Spring  Navigator:  Just1Navigator program, a?free service to help individuals and families through the journey of determining care for older adults.  The "Navigator" is a Education officer, museum, Arnell Asal, who will speak with a prospective client and/or loved ones to provide an assessment of the situation and a set of recommendations for a personalized care plan -- all free of charge, and whether?Well-Spring [employer] offers the needed service or not. If the need is not a service we provide, we are well-connected with reputable programs in town that we can refer you to.  www.well-[email or web address] or to speak with the Navigator, call [phone].

## 2023-02-19 ENCOUNTER — Ambulatory Visit (INDEPENDENT_AMBULATORY_CARE_PROVIDER_SITE_OTHER): Payer: 59 | Admitting: Family Medicine

## 2023-02-19 ENCOUNTER — Encounter: Payer: Self-pay | Admitting: Family Medicine

## 2023-02-19 ENCOUNTER — Encounter: Payer: Self-pay | Admitting: Gastroenterology

## 2023-02-19 VITALS — BP 120/70 | HR 84 | Temp 97.9°F | Ht 72.25 in | Wt 203.0 lb

## 2023-02-19 DIAGNOSIS — Z0000 Encounter for general adult medical examination without abnormal findings: Secondary | ICD-10-CM

## 2023-02-19 DIAGNOSIS — K219 Gastro-esophageal reflux disease without esophagitis: Secondary | ICD-10-CM | POA: Diagnosis not present

## 2023-02-19 LAB — CBC WITH DIFFERENTIAL/PLATELET
Basophils Absolute: 0 10*3/uL (ref 0.0–0.1)
Basophils Relative: 0.6 % (ref 0.0–3.0)
Eosinophils Absolute: 0.1 10*3/uL (ref 0.0–0.7)
Eosinophils Relative: 1.2 % (ref 0.0–5.0)
HCT: 40.6 % (ref 39.0–52.0)
Hemoglobin: 14 g/dL (ref 13.0–17.0)
Lymphocytes Relative: 29 % (ref 12.0–46.0)
Lymphs Abs: 1.4 10*3/uL (ref 0.7–4.0)
MCHC: 34.4 g/dL (ref 30.0–36.0)
MCV: 93.7 fl (ref 78.0–100.0)
Monocytes Absolute: 0.5 10*3/uL (ref 0.1–1.0)
Monocytes Relative: 10.6 % (ref 3.0–12.0)
Neutro Abs: 2.9 10*3/uL (ref 1.4–7.7)
Neutrophils Relative %: 58.6 % (ref 43.0–77.0)
Platelets: 237 10*3/uL (ref 150.0–400.0)
RBC: 4.33 Mil/uL (ref 4.22–5.81)
RDW: 13.2 % (ref 11.5–15.5)
WBC: 4.9 10*3/uL (ref 4.0–10.5)

## 2023-02-19 LAB — BASIC METABOLIC PANEL
BUN: 16 mg/dL (ref 6–23)
CO2: 33 mEq/L — ABNORMAL HIGH (ref 19–32)
Calcium: 9.3 mg/dL (ref 8.4–10.5)
Chloride: 99 mEq/L (ref 96–112)
Creatinine, Ser: 1.45 mg/dL (ref 0.40–1.50)
GFR: 54.89 mL/min — ABNORMAL LOW (ref >60.00)
Glucose, Bld: 111 mg/dL — ABNORMAL HIGH (ref 70–99)
Potassium: 3.8 mEq/L (ref 3.5–5.1)
Sodium: 139 mEq/L (ref 135–145)

## 2023-02-19 LAB — TSH: TSH: 0.45 u[IU]/mL (ref 0.35–5.50)

## 2023-02-19 LAB — LIPID PANEL
Cholesterol: 224 mg/dL — ABNORMAL HIGH (ref 0–200)
HDL: 85 mg/dL (ref >39.00)
LDL Cholesterol: 126 mg/dL — ABNORMAL HIGH (ref 0–99)
NonHDL: 139.16
Total CHOL/HDL Ratio: 3
Triglycerides: 65 mg/dL (ref 0.0–149.0)
VLDL: 13 mg/dL (ref 0.0–40.0)

## 2023-02-19 LAB — HEPATIC FUNCTION PANEL
ALT: 6 U/L (ref 0–53)
AST: 22 U/L (ref 0–37)
Albumin: 4.6 g/dL (ref 3.5–5.2)
Alkaline Phosphatase: 36 U/L — ABNORMAL LOW (ref 39–117)
Bilirubin, Direct: 0.2 mg/dL (ref 0.0–0.3)
Total Bilirubin: 0.9 mg/dL (ref 0.2–1.2)
Total Protein: 6.8 g/dL (ref 6.0–8.3)

## 2023-02-19 LAB — HEMOGLOBIN A1C: Hgb A1c MFr Bld: 5.6 % (ref 4.6–6.5)

## 2023-02-19 LAB — PSA: PSA: 1.52 ng/mL (ref 0.10–4.00)

## 2023-02-19 MED ORDER — FAMOTIDINE 40 MG PO TABS: 40.0000 mg | ORAL_TABLET | Freq: Every day | ORAL | 5 refills | Status: DC

## 2023-02-19 MED ORDER — OMEPRAZOLE 20 MG PO CPDR: 40.0000 mg | DELAYED_RELEASE_CAPSULE | Freq: Every morning | ORAL | 3 refills | Status: DC

## 2023-02-19 MED ORDER — AMLODIPINE BESYLATE 10 MG PO TABS: 10.0000 mg | ORAL_TABLET | Freq: Every day | ORAL | 3 refills | Status: DC

## 2023-02-19 MED ORDER — HYDROCHLOROTHIAZIDE 25 MG PO TABS: 25.0000 mg | ORAL_TABLET | Freq: Every day | ORAL | 3 refills | Status: DC

## 2023-02-19 NOTE — Progress Notes (Signed)
Subjective:    Patient ID: Casey Allen, male    DOB: [birth year], 52 y.o.   MRN: [id number]  HPI Here for a well exam. He has been seeing Dr. Arbutus Allen for his Parkinson's disease. He has been on Carbidopa/Levadopa for some time, and she recently added Pramipexole. His tremors have been getting a little worse lately. He had an appendectomy on 11-29-22, and he has recovered from that. He still enjoys running and lifting weights. He is still experiencing a mild chest pain when he runs that stated a few years ago. He describes this as a "burning" pain, and it lasts only a few minutes. There is no SOB with it. This only happens when he runs. He never feels these pains when he lifts weights, rides a bicycle, etc. He takes Omeprazole 40 mg every morning but he still has heartburn at times. No trouble swallowing. He had a normal treadmill stress test on 03-14-22. He is past due for another colonoscopy.    Review of Systems  Constitutional: Negative.   HENT: Negative.    Eyes: Negative.   Respiratory: Negative.    Cardiovascular:  Positive for chest pain.  Gastrointestinal: Negative.   Genitourinary: Negative.   Musculoskeletal: Negative.   Skin: Negative.   Neurological:  Positive for tremors.  Psychiatric/Behavioral: Negative.         Objective:   Physical Exam Constitutional:      General: He is not in acute distress.    Appearance: Normal appearance. He is well-developed. He is not diaphoretic.  HENT:     Head: Normocephalic and atraumatic.     Right Ear: External ear normal.     Left Ear: External ear normal.     Nose: Nose normal.     Mouth/Throat:     Pharynx: No oropharyngeal exudate.  Eyes:     General: No scleral icterus.       Right eye: No discharge.        Left eye: No discharge.     Conjunctiva/sclera: Conjunctivae normal.     Pupils: Pupils are equal, round, and reactive to light.  Neck:     Thyroid: No thyromegaly.     Vascular: No JVD.     Trachea: No tracheal  deviation.  Cardiovascular:     Rate and Rhythm: Normal rate and regular rhythm.     Pulses: Normal pulses.     Heart sounds: Normal heart sounds. No murmur heard.    No friction rub. No gallop.  Pulmonary:     Effort: Pulmonary effort is normal. No respiratory distress.     Breath sounds: Normal breath sounds. No wheezing or rales.  Chest:     Chest wall: No tenderness.  Abdominal:     General: Bowel sounds are normal. There is no distension.     Palpations: Abdomen is soft. There is no mass.     Tenderness: There is no abdominal tenderness. There is no guarding or rebound.  Genitourinary:    Penis: Normal. No tenderness.      Testes: Normal.     Prostate: Normal.     Rectum: Normal. Guaiac result negative.  Musculoskeletal:        General: No tenderness. Normal range of motion.     Cervical back: Neck supple.  Lymphadenopathy:     Cervical: No cervical adenopathy.  Skin:    General: Skin is warm and dry.     Coloration: Skin is not pale.     Findings: No  erythema or rash.  Neurological:     Mental Status: He is alert and oriented to person, place, and time.     Cranial Nerves: No cranial nerve deficit.     Motor: No abnormal muscle tone.     Coordination: Coordination normal.     Deep Tendon Reflexes: Reflexes are normal and symmetric. Reflexes normal.     Comments: He has resting tremors in both hands   Psychiatric:        Mood and Affect: Mood normal.        Behavior: Behavior normal.        Thought Content: Thought content normal.        Judgment: Judgment normal.           Assessment & Plan:  Well exam. We discussed diet and exercise. Get fasting labs. He seems to have GERD symptoms, so we will add Famotidine 40 mg every evening before supper. I think he would benefit from an upper endoscopy, so we will refer him to see his GI, Dr. Myrtie Allen, to consider combing this with his next colonoscopy.  Casey Crane, MD

## 2023-02-25 ENCOUNTER — Encounter: Payer: Self-pay | Admitting: Neurology

## 2023-02-25 ENCOUNTER — Other Ambulatory Visit: Payer: Self-pay | Admitting: Family Medicine

## 2023-03-04 ENCOUNTER — Other Ambulatory Visit: Payer: Self-pay | Admitting: Neurology

## 2023-03-17 ENCOUNTER — Ambulatory Visit (INDEPENDENT_AMBULATORY_CARE_PROVIDER_SITE_OTHER): Payer: 59 | Admitting: Dermatology

## 2023-03-17 VITALS — BP 118/74

## 2023-03-17 DIAGNOSIS — Z1283 Encounter for screening for malignant neoplasm of skin: Secondary | ICD-10-CM | POA: Diagnosis not present

## 2023-03-17 DIAGNOSIS — L814 Other melanin hyperpigmentation: Secondary | ICD-10-CM | POA: Diagnosis not present

## 2023-03-17 DIAGNOSIS — L578 Other skin changes due to chronic exposure to nonionizing radiation: Secondary | ICD-10-CM

## 2023-03-17 DIAGNOSIS — L821 Other seborrheic keratosis: Secondary | ICD-10-CM

## 2023-03-17 DIAGNOSIS — X32XXXA Exposure to sunlight, initial encounter: Secondary | ICD-10-CM

## 2023-03-17 DIAGNOSIS — W908XXA Exposure to other nonionizing radiation, initial encounter: Secondary | ICD-10-CM

## 2023-03-17 DIAGNOSIS — D229 Melanocytic nevi, unspecified: Secondary | ICD-10-CM

## 2023-03-17 DIAGNOSIS — L570 Actinic keratosis: Secondary | ICD-10-CM | POA: Diagnosis not present

## 2023-03-17 DIAGNOSIS — L573 Poikiloderma of Civatte: Secondary | ICD-10-CM

## 2023-03-17 DIAGNOSIS — Z85828 Personal history of other malignant neoplasm of skin: Secondary | ICD-10-CM

## 2023-03-17 DIAGNOSIS — D1801 Hemangioma of skin and subcutaneous tissue: Secondary | ICD-10-CM

## 2023-03-17 NOTE — Progress Notes (Signed)
   New Patient Visit   Subjective  Casey Allen is a 52 y.o. 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 He has a spot of right cheek that was treated with LN2 last year.   The patient presents for Total-Body Skin Exam (TBSE) for skin cancer screening and mole check. The patient has spots, moles and lesions to be evaluated, some may be new or changing and the patient has concerns that these could be cancer.    The following portions of the chart were reviewed this encounter and updated as appropriate: medications, allergies, medical history  Review of Systems:  No other skin or systemic complaints except as noted in HPI or Assessment and Plan.  Objective  Well appearing patient in no apparent distress; mood and affect are within normal limits.  A full examination was performed including scalp, head, eyes, ears, nose, lips, neck, chest, axillae, abdomen, back, buttocks, bilateral upper extremities, bilateral lower extremities, hands, feet, fingers, toes, fingernails, and toenails. All findings within normal limits unless otherwise noted below.   Relevant physical exam findings are noted in the Assessment and Plan.  Right cheek Erythematous thin papules/macules with gritty scale.     Assessment & Plan   HISTORY OF DFSP OF LEFT POST SHOULDER - Well healed excision site - no sign of recurrence.  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 Discussed laser treatment.  LENTIGINES, SEBORRHEIC KERATOSES, HEMANGIOMAS - Benign normal skin lesions - Benign-appearing - Call for any changes  MELANOCYTIC NEVI - Tan-brown and/or pink-flesh-colored symmetric macules and papules - Benign appearing on exam today - Observation - Call clinic for new or changing moles - Recommend daily use of broad spectrum spf 30+ sunscreen to  sun-exposed areas.   ACTINIC DAMAGE - Chronic condition, secondary to cumulative UV/sun exposure - diffuse scaly erythematous macules with underlying dyspigmentation - Recommend daily broad spectrum sunscreen SPF 30+ to sun-exposed areas, reapply every 2 hours as needed.  - Staying in the shade or wearing long sleeves, sun glasses (UVA+UVB protection) and wide brim hats (4-inch brim around the entire circumference of the hat) are also recommended for sun protection.  - Call for new or changing lesions.  SKIN CANCER SCREENING PERFORMED TODAY.    AK (actinic keratosis) Right cheek  Destruction of lesion - Right cheek Complexity: simple   Destruction method: cryotherapy   Informed consent: discussed and consent obtained   Timeout:  patient name, date of birth, surgical site, and procedure verified Lesion destroyed using liquid nitrogen: Yes   Region frozen until ice ball extended beyond lesion: Yes   Outcome: patient tolerated procedure well with no complications   Post-procedure details: wound care instructions given      Return in about 1 year (around 03/16/2024) for TBSE.  I, Joanie Coddington, CMA, am acting as scribe for [employer], DO .   Documentation: I have reviewed the above documentation for accuracy and completeness, and I agree with the above.  Langston Reusing, DO

## 2023-03-17 NOTE — Patient Instructions (Signed)
Cryotherapy Aftercare  Wash gently with soap and water everyday.   Apply Vaseline and Band-Aid daily until healed.   Due to recent changes in healthcare laws, you may see results of your pathology and/or laboratory studies on MyChart before the doctors have had a chance to review them. We understand that in some cases there may be results that are confusing or concerning to you. Please understand that not all results are received at the same time and often the doctors may need to interpret multiple results in order to provide you with the best plan of care or course of treatment. Therefore, we ask that you please give us 2 business days to thoroughly review all your results before contacting the office for clarification. Should we see a critical lab result, you will be contacted sooner.   If You Need Anything After Your Visit  If you have any questions or concerns for your doctor, please call our main line at [phone] If no one answers, please leave a voicemail as directed and we will return your call as soon as possible. Messages left after 4 pm will be answered the following business day.   You may also send us a message via MyChart. We typically respond to MyChart messages within 1-2 business days.  For prescription refills, please ask your pharmacy to contact our office. Our fax number is [phone].  If you have an urgent issue when the clinic is closed that cannot wait until the next business day, you can page your doctor at the number below.    Please note that while we do our best to be available for urgent issues outside of office hours, we are not available 24/7.   If you have an urgent issue and are unable to reach us, you may choose to seek medical care at your doctor's office, retail clinic, urgent care center, or emergency room.  If you have a medical emergency, please immediately call 911 or go to the emergency department. In the event of inclement weather, please call our  main line at [phone] for an update on the status of any delays or closures.  Dermatology Medication Tips: Please keep the boxes that topical medications come in in order to help keep track of the instructions about where and how to use these. Pharmacies typically print the medication instructions only on the boxes and not directly on the medication tubes.   If your medication is too expensive, please contact our office at [phone] or send us a message through MyChart.   We are unable to tell what your co-pay for medications will be in advance as this is different depending on your insurance coverage. However, we may be able to find a substitute medication at lower cost or fill out paperwork to get insurance to cover a needed medication.   If a prior authorization is required to get your medication covered by your insurance company, please allow us 1-2 business days to complete this process.  Drug prices often vary depending on where the prescription is filled and some pharmacies may offer cheaper prices.  The website www.goodrx.com contains coupons for medications through different pharmacies. The prices here do not account for what the cost may be with help from insurance (it may be cheaper with your insurance), but the website can give you the price if you did not use any insurance.  - You can print the associated coupon and take it with your prescription to the pharmacy.  - You may also   stop by our office during regular business hours and pick up a GoodRx coupon card.  - If you need your prescription sent electronically to a different pharmacy, notify our office through [place] MyChart or by phone at [phone]     

## 2023-04-09 ENCOUNTER — Encounter: Payer: Self-pay | Admitting: Dermatology

## 2023-04-27 ENCOUNTER — Emergency Department (HOSPITAL_BASED_OUTPATIENT_CLINIC_OR_DEPARTMENT_OTHER): Payer: 59

## 2023-04-27 ENCOUNTER — Other Ambulatory Visit: Payer: Self-pay

## 2023-04-27 ENCOUNTER — Other Ambulatory Visit (HOSPITAL_BASED_OUTPATIENT_CLINIC_OR_DEPARTMENT_OTHER): Payer: Self-pay

## 2023-04-27 ENCOUNTER — Encounter (HOSPITAL_BASED_OUTPATIENT_CLINIC_OR_DEPARTMENT_OTHER): Payer: Self-pay | Admitting: Emergency Medicine

## 2023-04-27 ENCOUNTER — Emergency Department (HOSPITAL_BASED_OUTPATIENT_CLINIC_OR_DEPARTMENT_OTHER)
Admission: EM | Admit: 2023-04-27 | Discharge: 2023-04-27 | Disposition: A | Discharge status: Home / Self Care | Payer: 59 | Attending: Emergency Medicine | Admitting: Emergency Medicine

## 2023-04-27 DIAGNOSIS — I10 Essential (primary) hypertension: Secondary | ICD-10-CM | POA: Insufficient documentation

## 2023-04-27 DIAGNOSIS — Z79899 Other long term (current) drug therapy: Secondary | ICD-10-CM | POA: Diagnosis not present

## 2023-04-27 DIAGNOSIS — U071 COVID-19: Secondary | ICD-10-CM | POA: Diagnosis not present

## 2023-04-27 DIAGNOSIS — G20C Parkinsonism, unspecified: Secondary | ICD-10-CM | POA: Insufficient documentation

## 2023-04-27 DIAGNOSIS — R0602 Shortness of breath: Secondary | ICD-10-CM | POA: Diagnosis present

## 2023-04-27 LAB — CBC WITH DIFFERENTIAL/PLATELET
Abs Immature Granulocytes: 0.01 10*3/uL (ref 0.00–0.07)
Basophils Absolute: 0 10*3/uL (ref 0.0–0.1)
Basophils Relative: 1 %
Eosinophils Absolute: 0 10*3/uL (ref 0.0–0.5)
Eosinophils Relative: 0 %
HCT: 40.6 % (ref 39.0–52.0)
Hemoglobin: 13.9 g/dL (ref 13.0–17.0)
Immature Granulocytes: 0 %
Lymphocytes Relative: 10 %
Lymphs Abs: 0.7 10*3/uL (ref 0.7–4.0)
MCH: 31.6 pg (ref 26.0–34.0)
MCHC: 34.2 g/dL (ref 30.0–36.0)
MCV: 92.3 fL (ref 80.0–100.0)
Monocytes Absolute: 0.5 10*3/uL (ref 0.1–1.0)
Monocytes Relative: 7 %
Neutro Abs: 5.9 10*3/uL (ref 1.7–7.7)
Neutrophils Relative %: 82 %
Platelets: 194 10*3/uL (ref 150–400)
RBC: 4.4 MIL/uL (ref 4.22–5.81)
RDW: 12.1 % (ref 11.5–15.5)
WBC: 7.1 10*3/uL (ref 4.0–10.5)
nRBC: 0 % (ref 0.0–0.2)

## 2023-04-27 LAB — BASIC METABOLIC PANEL
Anion gap: 11 (ref 5–15)
BUN: 15 mg/dL (ref 6–20)
CO2: 29 mmol/L (ref 22–32)
Calcium: 9.1 mg/dL (ref 8.9–10.3)
Chloride: 98 mmol/L (ref 98–111)
Creatinine, Ser: 1.41 mg/dL — ABNORMAL HIGH (ref 0.61–1.24)
GFR, Estimated: 59 mL/min — ABNORMAL LOW (ref >60)
Glucose, Bld: 121 mg/dL — ABNORMAL HIGH (ref 70–99)
Potassium: 3.3 mmol/L — ABNORMAL LOW (ref 3.5–5.1)
Sodium: 138 mmol/L (ref 135–145)

## 2023-04-27 LAB — GROUP A STREP BY PCR: Group A Strep by PCR: NOT DETECTED

## 2023-04-27 MED ORDER — ACETAMINOPHEN 160 MG/5ML PO SOLN
650.0000 mg | Freq: Once | ORAL | Status: AC
  Administered 2023-04-27: 650 mg via ORAL
  Filled 2023-04-27: qty 20.3

## 2023-04-27 MED ORDER — IOHEXOL 350 MG/ML SOLN
100.0000 mL | Freq: Once | INTRAVENOUS | Status: AC | PRN
  Administered 2023-04-27: 75 mL via INTRAVENOUS

## 2023-04-27 MED ORDER — ALBUTEROL SULFATE HFA 108 (90 BASE) MCG/ACT IN AERS
2.0000 | INHALATION_SPRAY | RESPIRATORY_TRACT | Status: DC | PRN
  Administered 2023-04-27: 2 via RESPIRATORY_TRACT
  Filled 2023-04-27: qty 6.7

## 2023-04-27 MED ORDER — AEROCHAMBER PLUS FLO-VU LARGE MISC
1.0000 | Freq: Once | Status: AC
  Administered 2023-04-27: 1
  Filled 2023-04-27: qty 1

## 2023-04-27 MED ORDER — ACETAMINOPHEN 325 MG PO TABS
650.0000 mg | ORAL_TABLET | Freq: Once | ORAL | Status: DC
  Filled 2023-04-27: qty 2

## 2023-04-27 NOTE — ED Triage Notes (Signed)
Pt arrives to ED with c/o Covid19 and SOB x3 days. Pt notes he was in [place] x2 weeks and got home yesterday.

## 2023-04-27 NOTE — ED Provider Notes (Signed)
[place] EMERGENCY DEPARTMENT AT [facility] Provider Note   CSN: [id number] Arrival date & time: 04/27/23  0800     History  Chief Complaint  Patient presents with   Covid Positive   Shortness of Breath    Casey Allen is a 52 y.o. male.  Patient is a 51 year old male with a history of hypertension and Parkinson's disease who presents with shortness of breath.  He started having some nasal congestion and coughing about 3 days ago.  He was currently in [place] at that time for trip and returned last night.  He took his temperature last night and noted to be elevated at 101.  He took a home COVID test that was positive yesterday.  He started having some shortness of breath during the night which is why he came into the ED today.  He denies any associated chest pain.  No leg pain or swelling.  He did use compression stocks during his long [employer].  He denies any nausea or vomiting.       Home Medications Prior to Admission medications   Medication Sig Start Date End Date Taking? Authorizing Provider  amLODipine (NORVASC) 10 MG tablet Take 1 tablet (10 mg total) by mouth daily. 02/19/23   Nelwyn Salisbury, MD  Carbidopa-Levodopa ER (SINEMET CR) 25-100 MG tablet controlled release 1 TABLET THREE TIMES A DAY AT 7AM/11AM/4PM 02/05/23   Tat, Octaviano Batty, DO  [employer] ALLERGY RELIEF-D 10-240 MG 24 hr tablet TAKE 1 TABLET BY MOUTH EVERY DAY 02/25/23   Panosh, Neta Mends, MD  Cyanocobalamin (B-12) 2500 MCG TABS  11/03/21   [provider]  famotidine (PEPCID) 40 MG tablet Take 1 tablet (40 mg total) by mouth daily before supper. 02/19/23   Nelwyn Salisbury, MD  Fish Oil OIL by Does not apply route.    [provider]  fluticasone Aleda Grana) 50 MCG/ACT nasal spray USE TWO SPRAYS IN [facility] NOSTRIL DAILY  12/16/13   Nelwyn Salisbury, MD  hydrochlorothiazide (HYDRODIURIL) 25 MG tablet Take 1 tablet (25 mg total) by mouth daily. 02/19/23   Nelwyn Salisbury, MD  Misc Natural Products  (GLUCOSAMINE CHOND COMPLEX/MSM PO) Take by mouth daily.    [provider]  Multiple Vitamin (MULTIVITAMIN) tablet Take 1 tablet by mouth daily.    [provider]  omeprazole (PRILOSEC) 20 MG capsule Take 2 capsules (40 mg total) by mouth in the morning. 02/19/23   Nelwyn Salisbury, MD  pramipexole (MIRAPEX) 0.125 MG tablet 1 po tid x 1 week, then 2 po tid x 1 wee, then 3 po tid 02/05/23   Tat, Octaviano Batty, DO  pramipexole (MIRAPEX) 0.5 MG tablet Take 1 tablet (0.5 mg total) by mouth 3 (three) times daily. 02/05/23   Tat, Octaviano Batty, DO  Vitamin D, Cholecalciferol, 1000 UNITS CAPS Take by mouth daily.    [provider]      Allergies    Cat hair extract    Review of Systems   Review of Systems  Constitutional:  Positive for fatigue and fever. Negative for chills and diaphoresis.  HENT:  Positive for congestion and rhinorrhea. Negative for sneezing.   Eyes: Negative.   Respiratory:  Positive for cough, shortness of breath and wheezing. Negative for chest tightness.   Cardiovascular:  Negative for chest pain and leg swelling.  Gastrointestinal:  Negative for abdominal pain, blood in stool, diarrhea, nausea and vomiting.  Genitourinary:  Negative for difficulty urinating, flank pain, frequency and  hematuria.  Musculoskeletal:  Negative for arthralgias and back pain.  Skin:  Negative for rash.  Neurological:  Positive for headaches. Negative for dizziness, speech difficulty, weakness and numbness.    Physical Exam Updated Vital Signs BP 134/82   Pulse 92   Temp (!) 100.7 F (38.2 C) (Oral)   Resp 17   Ht 6\' 2"  (1.88 m)   Wt 90.7 kg   SpO2 98%   BMI 25.68 kg/m  Physical Exam Constitutional:      Appearance: He is well-developed.  HENT:     Head: Normocephalic and atraumatic.     Mouth/Throat:     Comments: Mild erythema to the posterior pharynx, no exudates, uvula is midline Eyes:     Pupils: Pupils are equal, round, and reactive to light.  Cardiovascular:      Rate and Rhythm: Normal rate and regular rhythm.     Heart sounds: Normal heart sounds.  Pulmonary:     Effort: Pulmonary effort is normal. No respiratory distress.     Breath sounds: Decreased breath sounds and wheezing present. No rales.  Chest:     Chest wall: No tenderness.  Abdominal:     General: Bowel sounds are normal.     Palpations: Abdomen is soft.     Tenderness: There is no abdominal tenderness. There is no guarding or rebound.  Musculoskeletal:        General: Normal range of motion.     Cervical back: Normal range of motion and neck supple.     Comments: No edema or calf tenderness  Lymphadenopathy:     Cervical: No cervical adenopathy.  Skin:    General: Skin is warm and dry.     Findings: No rash.  Neurological:     Mental Status: He is alert and oriented to person, place, and time.     ED Results / Procedures / Treatments   Labs (all labs ordered are listed, but only abnormal results are displayed) Labs Reviewed  BASIC METABOLIC PANEL - Abnormal; Notable for the following components:      Result Value   Potassium 3.3 (*)    Glucose, Bld 121 (*)    Creatinine, Ser 1.41 (*)    GFR, Estimated 59 (*)    All other components within normal limits  GROUP A STREP BY PCR  CBC WITH DIFFERENTIAL/PLATELET    EKG EKG Interpretation  Date/Time:  Monday April 27 2023 08:17:03 EDT Ventricular Rate:  88 PR Interval:  157 QRS Duration: 90 QT Interval:  339 QTC Calculation: 411 R Axis:   -13 Text Interpretation: Sinus rhythm Abnormal R-wave progression, early transition since last tracing no significant change Confirmed by Rolan Bucco [phone]) on 04/27/2023 9:18:23 AM  Radiology CT Angio Chest PE W/Cm &/Or Wo Cm  Result Date: 04/27/2023 CLINICAL DATA:  COVID positive. Recent trip to [place]. Throat pain and shortness of breath. Pulmonary embolism suspected, high probability. EXAM: CT ANGIOGRAPHY CHEST WITH CONTRAST TECHNIQUE: Multidetector CT imaging of the  chest was performed using the standard protocol during bolus administration of intravenous contrast. Multiplanar CT image reconstructions and MIPs were obtained to evaluate the vascular anatomy. RADIATION DOSE REDUCTION: This exam was performed according to the departmental dose-optimization program which includes automated exposure control, adjustment of the mA and/or kV according to patient size and/or use of iterative reconstruction technique. CONTRAST:  75mL OMNIPAQUE IOHEXOL 350 MG/ML SOLN COMPARISON:  Chest radiographs 04/27/2023 and 08/23/2021; CT chest without contrast 09/25/2021 FINDINGS: Cardiovascular: The main pulmonary artery  is opacified up to 167 Hounsfield units. There is slightly greater opacification of the aorta compared to the main pulmonary artery. This suboptimal contrast opacification limits evaluation of the segmental and more distal pulmonary arteries for pulmonary embolism. Within this limitation, no central pulmonary embolism is identified. Heart size is normal. No pericardial effusion. No thoracic aortic aneurysm. Mediastinum/Nodes: No axillary, mediastinal, or hilar pathologically enlarged lymph nodes by CT criteria. The visualized thyroid is unremarkable. The esophagus follows a normal course and normal caliber. Lungs/Pleura: The central airways are patent. Unchanged mild curvilinear scarring within the anterior inferior aspect of the middle lobe. There is new mild patchy ground-glass opacification within the right lower lobe (axial series 6 images 97 through 116). Similar mild new ground-glass opacification within portions of left lower lobe. This is favored represent bibasilar subsegmental atelectasis. A 5 mm triangular density abutting the posterior aspect of the left major fissure (axial series 6, image 81 and sagittal series 8, image 153) is unchanged from 09/25/2021 and compatible with a benign intra fissural lymph node. There is again some linear scarring extending posteriorly  from this density, best seen on the sagittal image anastomotic change from prior. The stability from the prior CTA further suggests a benign process. No pleural effusion or pneumothorax. Upper Abdomen: No acute abnormality. Musculoskeletal: No chest wall abnormality. No acute or significant osseous findings. Review of the MIP images confirms the above findings. IMPRESSION: 1. Suboptimal contrast bolus timing for the evaluation of a pulmonary embolism. Within this limitation, no central pulmonary embolism is identified. 2. Left lower lobe triangular nodular density abutting the left major fissure is stable from 09/25/2021. This is likely benign and compatible with an intrafissural lymph node. No follow-up imaging is recommended. Electronically Signed   By: Neita Garnet M.D.   On: 04/27/2023 10:48   DG Chest Port 1 View  Result Date: 04/27/2023 CLINICAL DATA:  Shortness of breath. EXAM: PORTABLE CHEST 1 VIEW COMPARISON:  Chest x-ray August 23, 2021. FINDINGS: The heart size and mediastinal contours are within normal limits. Both lungs are clear. No visible pleural effusions or pneumothorax. No acute osseous abnormality. IMPRESSION: No active disease. Electronically Signed   By: Feliberto Harts M.D.   On: 04/27/2023 08:43    Procedures Procedures    Medications Ordered in ED Medications  albuterol (VENTOLIN HFA) 108 (90 Base) MCG/ACT inhaler 2 puff (2 puffs Inhalation Given 04/27/23 0824)  AeroChamber Plus Flo-Vu Large MISC 1 each (1 each Other Given 04/27/23 0828)  acetaminophen (TYLENOL) 160 MG/5ML solution 650 mg (650 mg Oral Given 04/27/23 0916)  iohexol (OMNIPAQUE) 350 MG/ML injection 100 mL (75 mLs Intravenous Contrast Given 04/27/23 1018)    ED Course/ Medical Decision Making/ A&P                             Medical Decision Making Amount and/or Complexity of Data Reviewed Labs: ordered. Radiology: ordered.  Risk OTC drugs. Prescription drug management.   Patient is a 52 year old  who presents with fevers cough and congestion for the last 3 days.  He tested positive for COVID-19 at home.  His chest x-ray does not show any evidence of pneumonia.  This was interpreted by me and confirmed by the radiologist.  His labs are nonconcerning.  His creatinine is only elevated but similar to prior values.  Given his recent travel history in association with COVID 19 and shortness of breath, I did a CTA of his chest which  shows no evidence of PE.  No infiltrate.  He has no hypoxia.  No increased work of breathing.  He did have some improvement using albuterol inhaler.  Will discharge him with this.  He can use this every 4-6 hours as needed at home.  Also discussed using Mucinex to improve the thick congestion that he is having.  Discussed other symptomatic care measures, return precautions were given.  Final Clinical Impression(s) / ED Diagnoses Final diagnoses:  COVID-19 virus infection    Rx / DC Orders ED Discharge Orders     None         Rolan Bucco, MD 04/27/23 1157

## 2023-04-27 NOTE — Progress Notes (Signed)
RT ambulated the Pt on RA and his O2 was 98%

## 2023-04-29 ENCOUNTER — Telehealth: Payer: Self-pay | Admitting: Gastroenterology

## 2023-04-29 ENCOUNTER — Other Ambulatory Visit: Payer: Self-pay | Admitting: Family Medicine

## 2023-04-29 NOTE — Telephone Encounter (Signed)
Inbound call from patient , he has a appointment coming up on 7/3 with Dr.Danis and he said that he tested positive for Covid Sunday night and want to know if he needs to reschedule appt.Please advise

## 2023-04-30 ENCOUNTER — Encounter: Payer: Self-pay | Admitting: Family Medicine

## 2023-04-30 ENCOUNTER — Telehealth: Payer: 59 | Admitting: Family Medicine

## 2023-04-30 DIAGNOSIS — J029 Acute pharyngitis, unspecified: Secondary | ICD-10-CM | POA: Diagnosis not present

## 2023-04-30 DIAGNOSIS — U071 COVID-19: Secondary | ICD-10-CM | POA: Diagnosis not present

## 2023-04-30 DIAGNOSIS — R051 Acute cough: Secondary | ICD-10-CM

## 2023-04-30 IMAGING — CT CT CHEST HIGH RESOLUTION
2 of 7 series · 14 of 36 positions shown, 17 images · non-contrast
Comparison: 08/23/2021 chest radiograph.

CLINICAL DATA: Chronic cough for 2 years.

EXAM:
CT CHEST WITHOUT CONTRAST
TECHNIQUE: Multidetector CT imaging of the chest was performed following the
standard protocol without intravenous contrast. High resolution
imaging of the lungs, as well as inspiratory and expiratory imaging,
was performed.

[Series 4: high resolution · axial · 0.73mm/px · z∈[-305,-9]mm · 11 of 356 slices shown, 14 images]
[im 30/356  mediastinal]
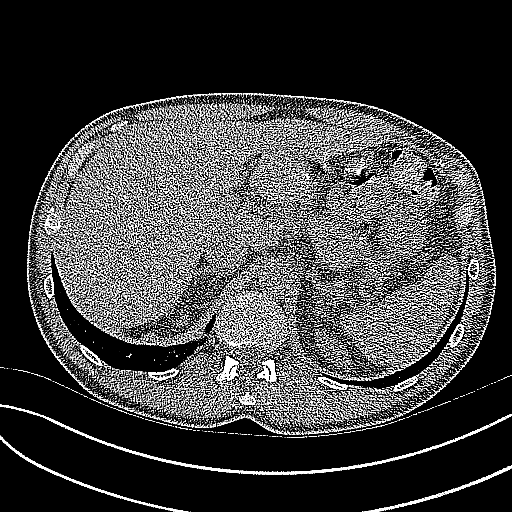
[im 30/356  lung]
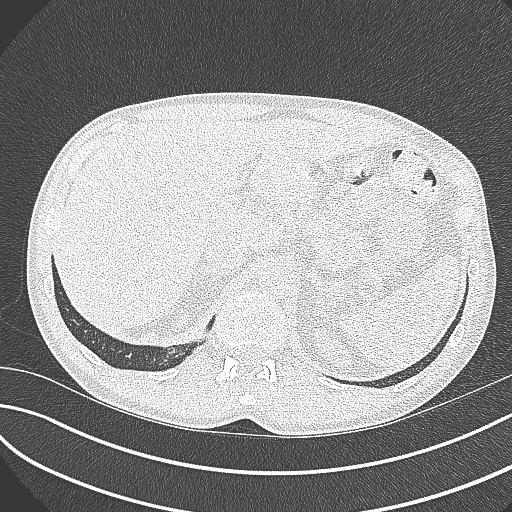
[im 60/356  lung]
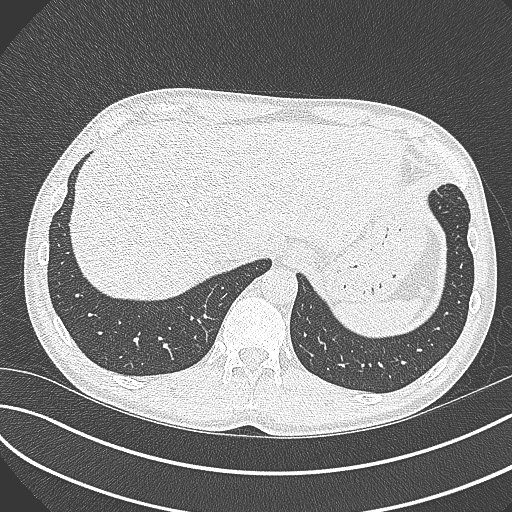
[im 89/356  lung]
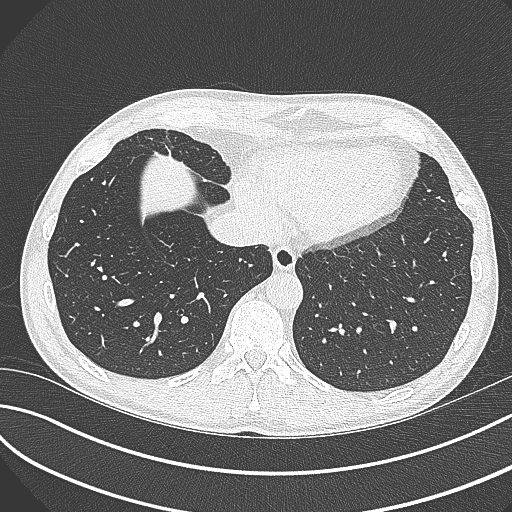
[im 119/356  lung]
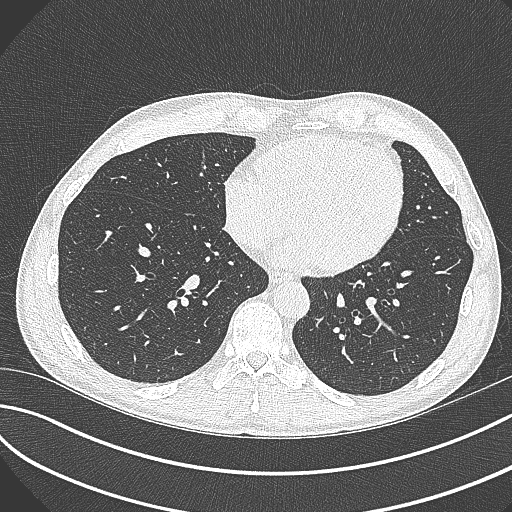
[im 148/356  mediastinal]
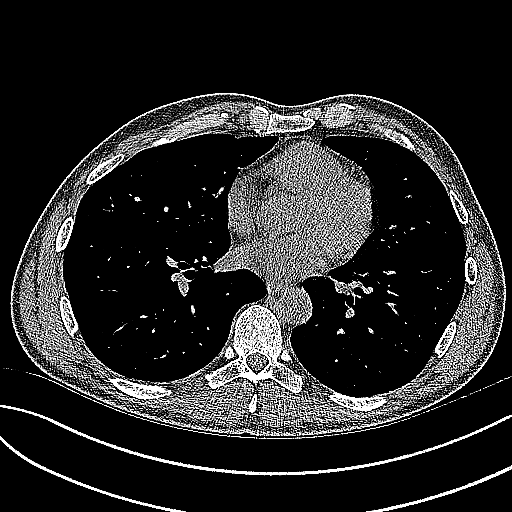
[im 148/356  lung]
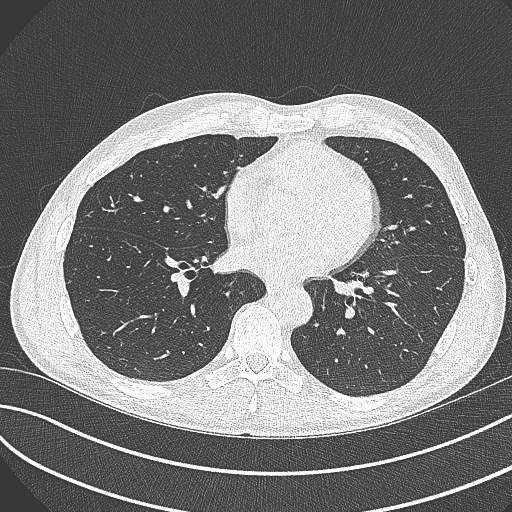
[im 178/356  lung]
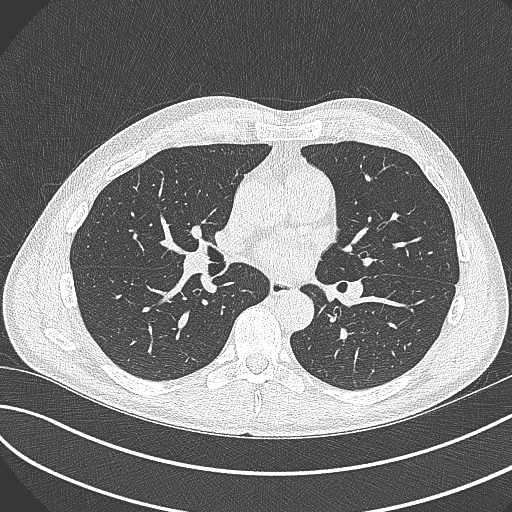
[im 208/356  lung]
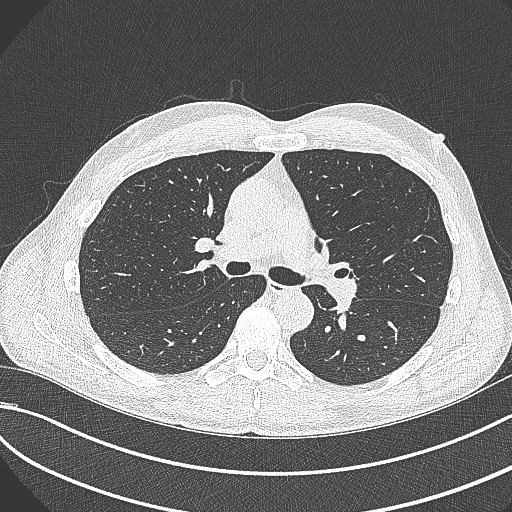
[im 237/356  lung]
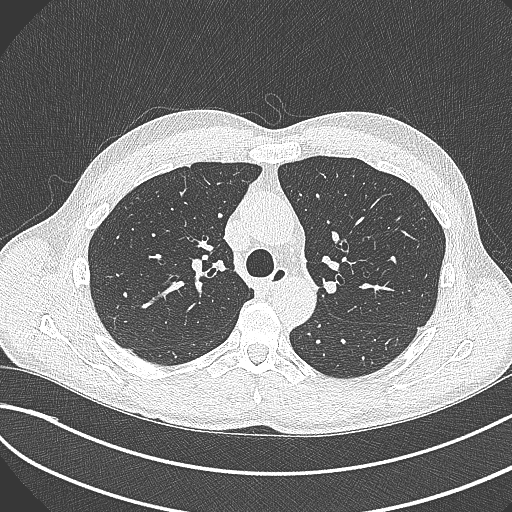
[im 267/356  mediastinal]
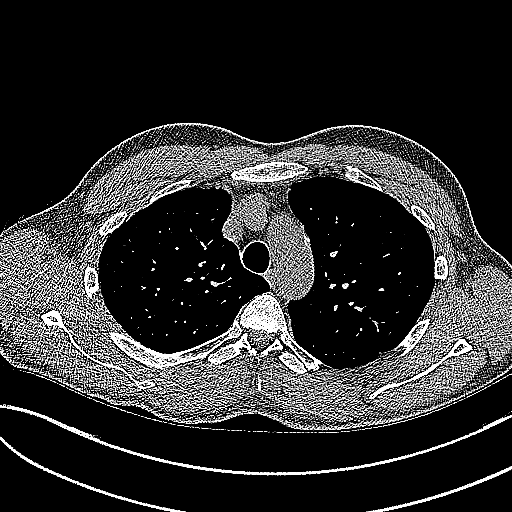
[im 267/356  lung]
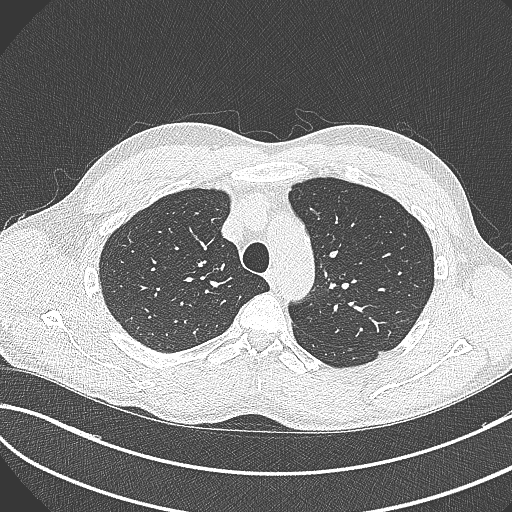
[im 296/356  lung]
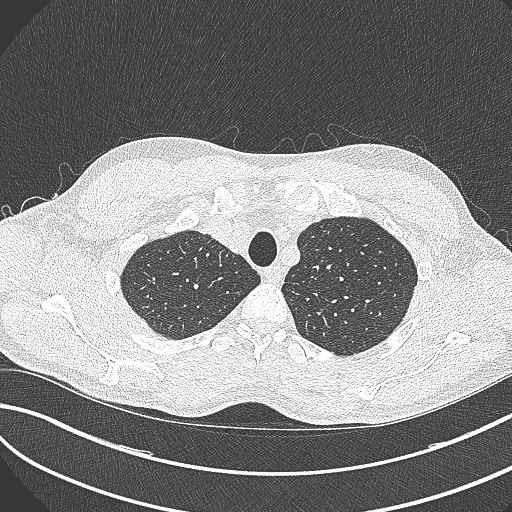
[im 326/356  lung]
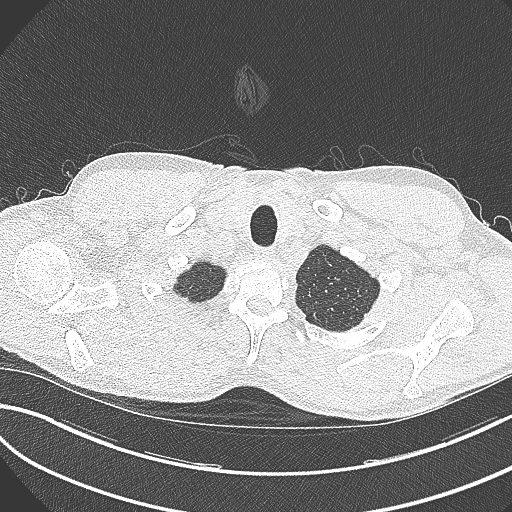

[Series 8: coronal · coronal · 0.72mm/px · 3 of 133 slices shown]
[im 27/133  lung]
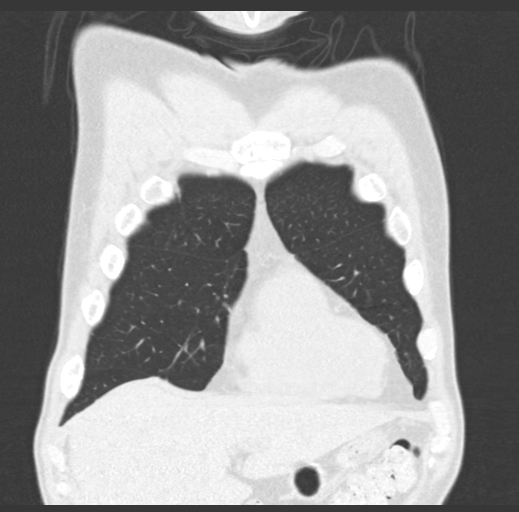
[im 53/133  lung]
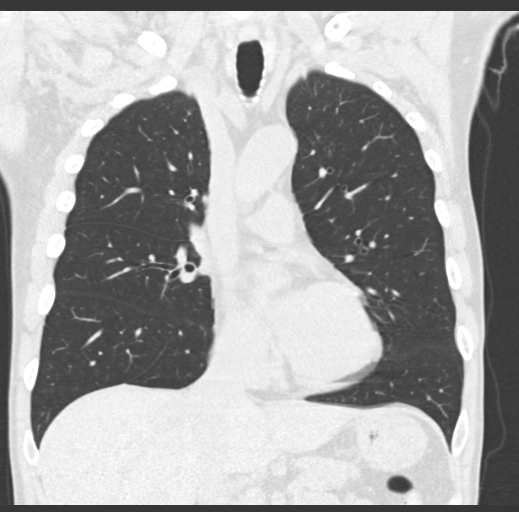
[im 80/133  lung]
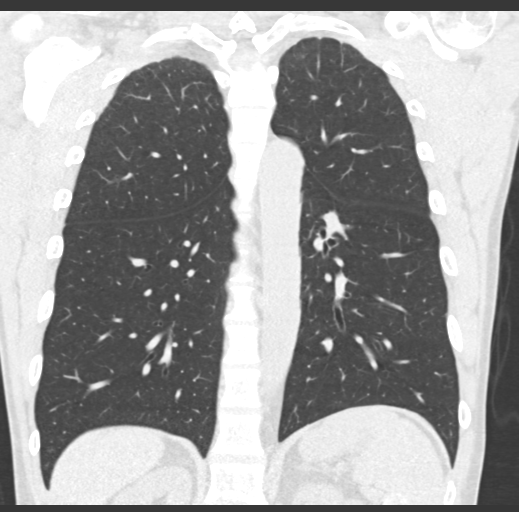

[14 of 36 positions shown; findings below may reference images not displayed]

FINDINGS: Cardiovascular: Normal heart size. No significant pericardial
effusion/thickening. Mildly atherosclerotic nonaneurysmal thoracic
aorta. Normal caliber pulmonary arteries.

Mediastinum/Nodes: No discrete thyroid nodules. Unremarkable
esophagus. No pathologically enlarged axillary, mediastinal or hilar
lymph nodes, noting limited sensitivity for the detection of hilar
adenopathy on this noncontrast study.

Lungs/Pleura: No pneumothorax. No pleural effusion. No acute
consolidative airspace disease or lung masses. 2 small solid
anterior left lower lobe pulmonary nodules along the left major
fissure, largest 0.3 cm (series 3/image 88). No significant lobular
air trapping or evidence of tracheobronchomalacia on the expiration
sequence. No significant regions of subpleural reticulation,
ground-glass attenuation, traction bronchiectasis, architectural
distortion, parenchymal banding or frank honeycombing.

Upper abdomen: No acute abnormality.

Musculoskeletal:  No aggressive appearing focal osseous lesions.
IMPRESSION: 1. No evidence of interstitial lung disease.
2. Two small solid left lower lobe pulmonary nodules along left
major fissure, largest 0.3 cm, very likely benign. No follow-up
needed if patient is low-risk (and has no known or suspected primary
neoplasm). Non-contrast chest CT can be considered in 12 months if
patient is high-risk. This recommendation follows the consensus
statement: Guidelines for Management of Incidental Pulmonary Nodules
Detected on CT Images: From the [HOSPITAL] 9535; Radiology
3. Aortic Atherosclerosis ([id number]-ZA4.4).

## 2023-04-30 MED ORDER — AMOXICILLIN-POT CLAVULANATE 500-125 MG PO TABS
1.0000 | ORAL_TABLET | Freq: Two times a day (BID) | ORAL | 0 refills | Status: AC
Start: 2023-04-30 — End: 2023-05-10

## 2023-04-30 MED ORDER — BENZONATATE 200 MG PO CAPS
200.0000 mg | ORAL_CAPSULE | Freq: Two times a day (BID) | ORAL | 0 refills | Status: DC | PRN
Start: 2023-04-30 — End: 2023-05-06

## 2023-04-30 NOTE — Progress Notes (Signed)
Virtual Visit via Video Note  I connected with Casey Allen on 04/30/23 at  2:00 PM EDT by a video enabled telemedicine application and verified that I am speaking with the correct person using two identifiers.  Location patient: home Location provider:work or home office Persons participating in the virtual visit: patient, provider  I discussed the limitations of evaluation and management by telemedicine and the availability of in person appointments. The patient expressed understanding and agreed to proceed. Chief Complaint  Patient presents with   Covid Positive    Tested "Sunday evening. Cough wont go away, ST hard to swallow, not able to eat anything, headache and body ache from coughing.alternating tylenol and advil, and tried cough suppressants, not helping     HPI:  Pt is a 52 yo male followed by Dr. Fry and seen for acute concern.  Pt came back from [place] on Sunday.  Had trouble breathing and fever on Monday so he went to the ED. tested positive for COVID.  Told to take Ibuprofen and tylenol.  Strep test was negative.  Notes difficulty sleeping due to cough, sore throat, dysphagia, HA, pain in b/l ears.  Denies n/v, facial pain/pressure.  Trying to stay hydrated and eating bananas.  Tried OTC meds.  Temperature now 99 after alternating Tylenol and ibuprofen.  ROS: See pertinent positives and negatives per HPI.  Past Medical History:  Diagnosis Date   Allergic rhinitis    Allergy    Asthma    Cancer (HCC)    skin cancer- left shoulder   GERD (gastroesophageal reflux disease)    Headache(784.0)    History of chickenpox    History of nephrolithiasis    Hypertension    Parkinson's disease 02/28/2022   Skin exam, screening for cancer    sees Dr. Myracle     Past Surgical History:  Procedure Laterality Date   COLONOSCOPY  12/06/2019   per Dr. 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  Mattew Tsuei and then had MOHS on 2/11/110 per DR. Albertini     20" 10   LAPAROSCOPIC APPENDECTOMY N/A 11/29/2022   Procedure: APPENDECTOMY LAPAROSCOPIC;  Surgeon: Violeta Gelinas, MD;  Location: [facility] OR;  Service: General;  Laterality: N/A;   LASIK     rt achilles tendon repair  09/2000   SHOULDER SURGERY Right     Family History  Problem Relation Age of Onset   Hypertension Father    Dementia Father    Diabetes Father    Myasthenia gravis Sister    Hypertension Other    Lung cancer Other    Colon cancer Neg Hx    Esophageal cancer Neg Hx    Rectal cancer Neg Hx    Stomach cancer Neg Hx     Current Outpatient Medications:    albuterol (VENTOLIN HFA) 108 (90 Base) MCG/ACT inhaler, INHALE 2 PUFFS INTO THE LUNGS EVERY 4 HOURS AS NEEDED FOR WHEEZING OR SHORTNESS OF BREATH (BEFORE EXERCISE), Disp: 6.7 each, Rfl: 5   amLODipine (NORVASC) 10 MG tablet, Take 1 tablet (10 mg total) by mouth daily., Disp: 90 tablet, Rfl: 3   Carbidopa-Levodopa ER (SINEMET CR) 25-100 MG tablet controlled release, 1 TABLET THREE TIMES A DAY AT 7AM/11AM/4PM, Disp: 270 tablet, Rfl: 1   [employer] ALLERGY RELIEF-D 10-240 MG 24 hr tablet, TAKE 1 TABLET BY MOUTH EVERY DAY, Disp: 90 tablet, Rfl: 3   Cyanocobalamin (B-12) 2500 MCG TABS, , Disp: ,  Rfl:    famotidine (PEPCID) 40 MG tablet, Take 1 tablet (40 mg total) by mouth daily before supper., Disp: 30 tablet, Rfl: 5   Fish Oil OIL, by Does not apply route., Disp: , Rfl:    fluticasone (FLONASE) 50 MCG/ACT nasal spray, USE TWO SPRAYS IN EACH NOSTRIL DAILY , Disp: 16 g, Rfl: 2   hydrochlorothiazide (HYDRODIURIL) 25 MG tablet, Take 1 tablet (25 mg total) by mouth daily., Disp: 90 tablet, Rfl: 3   Misc Natural Products (GLUCOSAMINE CHOND COMPLEX/MSM PO), Take by mouth daily., Disp: , Rfl:    Multiple Vitamin (MULTIVITAMIN) tablet, Take 1 tablet by mouth daily., Disp: , Rfl:    omeprazole (PRILOSEC) 20 MG capsule, Take 2 capsules (40 mg total) by mouth in the morning., Disp: 30  capsule, Rfl: 3   pramipexole (MIRAPEX) 0.5 MG tablet, Take 1 tablet (0.5 mg total) by mouth 3 (three) times daily., Disp: 270 tablet, Rfl: 1   Vitamin D, Cholecalciferol, 1000 UNITS CAPS, Take by mouth daily., Disp: , Rfl:    pramipexole (MIRAPEX) 0.125 MG tablet, 1 po tid x 1 week, then 2 po tid x 1 wee, then 3 po tid, Disp: 73 tablet, Rfl: 0  EXAM:  VITALS per patient if applicable: RR between 12-20 bpm  GENERAL: alert, oriented, appears well and in no acute distress  HEENT: Tenderness with erythema, tonsils enlarged with exudate.   atraumatic, conjunctiva clear, no obvious abnormalities on inspection of external nose and ears  NECK: normal movements of the head and neck  LUNGS: on inspection no signs of respiratory distress, breathing rate appears normal, no obvious gross SOB, gasping or wheezing  CV: no obvious cyanosis  MS: moves all visible extremities without noticeable abnormality  PSYCH/NEURO: pleasant and cooperative, no obvious depression or anxiety, speech and thought processing grossly intact  ASSESSMENT AND PLAN:  Discussed the following assessment and plan:  COVID-19 virus infection - Plan: amoxicillin-clavulanate (AUGMENTIN) 500-125 MG tablet, benzonatate (TESSALON) 200 MG capsule  Acute cough  Sore throat  Patient with positive COVID test on 04/27/2023.  Continue supportive care.  Strep testing in ED negative however patient with enlarged tonsils with exudate on exam.  Start ABX for possible strep pharyngitis.  Given precautions.  Follow-up with PCP for continued or worsening symptoms.   I discussed the assessment and treatment plan with the patient. The patient was provided an opportunity to ask questions and all were answered. The patient agreed with the plan and demonstrated an understanding of the instructions.   The patient was advised to call back or seek an in-person evaluation if the symptoms worsen or if the condition fails to improve as  anticipated.   Deeann Saint, MD

## 2023-04-30 NOTE — Telephone Encounter (Signed)
Called and left patient a detailed vm letting him know that he is fine to keep his appt as scheduled for 7/3 as long as he is symptom free. He will be out of his 5 day quarantine window.

## 2023-04-30 NOTE — Telephone Encounter (Signed)
Patient calling to f/u.

## 2023-05-06 ENCOUNTER — Ambulatory Visit (INDEPENDENT_AMBULATORY_CARE_PROVIDER_SITE_OTHER): Payer: 59 | Admitting: Gastroenterology

## 2023-05-06 ENCOUNTER — Encounter: Payer: Self-pay | Admitting: Family Medicine

## 2023-05-06 ENCOUNTER — Encounter: Payer: Self-pay | Admitting: Gastroenterology

## 2023-05-06 VITALS — BP 86/60 | HR 96 | Ht 72.25 in | Wt 184.5 lb

## 2023-05-06 DIAGNOSIS — G8929 Other chronic pain: Secondary | ICD-10-CM | POA: Diagnosis not present

## 2023-05-06 DIAGNOSIS — Z86010 Personal history of colonic polyps: Secondary | ICD-10-CM | POA: Diagnosis not present

## 2023-05-06 DIAGNOSIS — R1013 Epigastric pain: Secondary | ICD-10-CM

## 2023-05-06 DIAGNOSIS — R0789 Other chest pain: Secondary | ICD-10-CM | POA: Diagnosis not present

## 2023-05-06 MED ORDER — NA SULFATE-K SULFATE-MG SULF 17.5-3.13-1.6 GM/177ML PO SOLN: 1.0000 | Freq: Once | ORAL | 0 refills | Status: AC

## 2023-05-06 NOTE — Progress Notes (Signed)
[facility] Consult Note:  History: Casey Allen 05/06/2023  Referring provider: Nelwyn Salisbury, MD  Reason for consult/chief complaint: Abdominal Pain (Epigastric abd pains after running since 2021 and getting worse, to discuss having an ECL)   Subjective  HPI: Summary of GI issues: For screening colonoscopy with Dr. Myrtie Neither February 2021-complete exam, good prep, 2 subcentimeter tubular adenomas and a 10 mm transverse colon SSP.  Internal hemorrhoids.  3-year recall recommended. ______________  Casey Allen was here requesting evaluation for something that has occurred for the last few years.  He gets epigastric to lower sternal discomfort when running, usually after about 1/2 mile or more.  This will then cause him to stop running, the symptoms will occur for a bit longer before subsiding, then he can resume exercise at a lower pace.  He has had a cardiopulmonary workup without clear explanation.  There was suspicion this may be reflux related so he has been on acid suppression for some time but without any improvement.  The problem seems to be getting worse and he wondered if this could be reflux related. Reports having had "a lung scan" in the past for workup of the symptoms.  Saw ENT in the past for some throat discomfort and chronic cough, and he has intermittent feelings of some foods or pills briefly feeling stuck in the neck/throat.  Bowel habits regular without rectal bleeding.  Occasionally goes every other day, something which she attributes to his Parkinson's.  Normal Cardiac exercise stress test May 2023  COVID positive 04/27/23 - ED visit       at that visit, he also had a CT angio of the chest to rule out pulmonary embolism because he had recently had a long airplane flight.  See CT abdomen and pelvis report below during hospital visit for acute appendicitis January 2024. ROS:  Review of Systems  Constitutional:  Negative for appetite change and  unexpected weight change.  HENT:  Negative for mouth sores and voice change.   Eyes:  Negative for pain and redness.  Respiratory:  Negative for cough and shortness of breath.   Cardiovascular:  Negative for chest pain and palpitations.  Genitourinary:  Negative for dysuria and hematuria.  Musculoskeletal:  Negative for arthralgias and myalgias.  Skin:  Negative for pallor and rash.  Neurological:  Negative for weakness and headaches.  Hematological:  Negative for adenopathy.     Past Medical History: Past Medical History:  Diagnosis Date   Allergic rhinitis    Allergy    Asthma    Cancer (HCC)    skin cancer- left shoulder, DFSP   GERD (gastroesophageal reflux disease)    Headache(784.0)    History of chickenpox    History of nephrolithiasis    HLD (hyperlipidemia)    Hypertension    Parkinson's disease 02/28/2022   Skin exam, screening for cancer    sees Dr. Koleen Nimrod      Past Surgical History: Past Surgical History:  Procedure Laterality Date   COLONOSCOPY  12/06/2019   per Dr. 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 Octaviano Glow and then had MOHS on 2/11/110 per DR. Albertini     2010   LAPAROSCOPIC APPENDECTOMY N/A 11/29/2022   Procedure: APPENDECTOMY LAPAROSCOPIC;  Surgeon: Violeta Gelinas, MD;  Location: [facility] OR;  Service: General;  Laterality: N/A;   LASIK     rt achilles tendon repair  09/2000  SHOULDER SURGERY Bilateral      Family History: Family History  Problem Relation Age of Onset   Skin cancer Mother    Hypertension Father    Dementia Father    Diabetes Father    Myasthenia gravis Sister    Dementia Paternal Grandmother    Lung cancer Paternal Grandfather    Colon cancer Neg Hx    Esophageal cancer Neg Hx    Rectal cancer Neg Hx    Stomach cancer Neg Hx     Social History: Social History   Socioeconomic History   Marital status: Married    Spouse  name: Not on file   Number of children: 2   Years of education: Not on file   Highest education level: Not on file  Occupational History   Occupation: IT work  Tobacco Use   Smoking status: Never   Smokeless tobacco: Never  Vaping Use   Vaping Use: Never used  Substance and Sexual Activity   Alcohol use: Yes    Alcohol/week: 0.0 standard drinks of alcohol    Comment: 5-6 beers per week   Drug use: No   Sexual activity: Not on file  Other Topics Concern   Not on file  Social History Narrative   MarriedRegular exercise   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   Social Determinants of Health   Financial Resource Strain: Not on file  Food Insecurity: Not on file  Transportation Needs: Not on file  Physical Activity: Not on file  Stress: Not on file  Social Connections: Not on file    Allergies: Allergies  Allergen Reactions   Cat Hair Extract     Outpatient Meds: Current Outpatient Medications  Medication Sig Dispense Refill   albuterol (VENTOLIN HFA) 108 (90 Base) MCG/ACT inhaler INHALE 2 PUFFS INTO THE LUNGS EVERY 4 HOURS AS NEEDED FOR WHEEZING OR SHORTNESS OF BREATH (BEFORE EXERCISE) 6.7 each 5   amLODipine (NORVASC) 10 MG tablet Take 1 tablet (10 mg total) by mouth daily. 90 tablet 3   amoxicillin-clavulanate (AUGMENTIN) 500-125 MG tablet Take 1 tablet by mouth in the morning and at bedtime for 10 days. 20 tablet 0   Carbidopa-Levodopa ER (SINEMET CR) 25-100 MG tablet controlled release 1 TABLET THREE TIMES A DAY AT 7AM/11AM/4PM 270 tablet 1   [employer] ALLERGY RELIEF-D 10-240 MG 24 hr tablet TAKE 1 TABLET BY MOUTH EVERY DAY 90 tablet 3   Cyanocobalamin (B-12) 2500 MCG TABS      famotidine (PEPCID) 40 MG tablet Take 1 tablet (40 mg total) by mouth daily before supper. 30 tablet 5   Fish Oil OIL by Does not apply route.     fluticasone (FLONASE) 50 MCG/ACT nasal spray USE TWO SPRAYS IN EACH NOSTRIL DAILY  16 g 2   hydrochlorothiazide (HYDRODIURIL) 25 MG tablet Take 1 tablet (25 mg  total) by mouth daily. 90 tablet 3   Misc Natural Products (GLUCOSAMINE CHOND COMPLEX/MSM PO) Take by mouth daily.     Multiple Vitamin (MULTIVITAMIN) tablet Take 1 tablet by mouth daily.     Na Sulfate-K Sulfate-Mg Sulf 17.5-3.13-1.6 GM/177ML SOLN Take 1 kit by mouth once for 1 dose. 354 mL 0   omeprazole (PRILOSEC) 20 MG capsule Take 2 capsules (40 mg total) by mouth in the morning. 30 capsule 3   pramipexole (MIRAPEX) 0.5 MG tablet Take 1 tablet (0.5 mg total) by mouth 3 (three) times daily. 270 tablet 1   Vitamin D, Cholecalciferol, 1000 UNITS CAPS Take by mouth  daily.     No current facility-administered medications for this visit.      ___________________________________________________________________ Objective   Exam:  BP (!) 86/60 (BP Location: Left Arm, Patient Position: Sitting, Cuff Size: Normal)   Pulse 96   Ht 6' 0.25" (1.835 m) Comment: height measured without shoes  Wt 184 lb 8 oz (83.7 kg)   BMI 24.85 kg/m  Wt Readings from Last 3 Encounters:  05/06/23 184 lb 8 oz (83.7 kg)  04/27/23 200 lb (90.7 kg)  02/19/23 203 lb (92.1 kg)    General: Pleasant and conversational, normal vocal quality, somewhat restricted affect Eyes: sclera anicteric, no redness ENT: oral mucosa moist without lesions, no cervical or supraclavicular lymphadenopathy CV: Regular without appreciable murmur, no JVD, no peripheral edema Resp: clear to auscultation bilaterally, normal RR and effort noted GI: soft, no tenderness, with active bowel sounds. No guarding or palpable organomegaly noted.  No bruit Skin; warm and dry, no rash or jaundice noted Neuro: Fine resting tremor of hands  Labs:     Latest Ref Rng & Units 04/27/2023    9:25 AM 02/19/2023   10:06 AM 11/29/2022   11:17 AM  CBC  WBC 4.0 - 10.5 K/uL 7.1  4.9  9.4   Hemoglobin 13.0 - 17.0 g/dL 16.1  09.6  04.5   Hematocrit 39.0 - 52.0 % 40.6  40.6  40.7   Platelets 150 - 400 K/uL 194  237.0  205       Latest Ref Rng & Units  04/27/2023    9:25 AM 02/19/2023   10:06 AM 11/29/2022   11:17 AM  CMP  Glucose 70 - 99 mg/dL 409  811  914   BUN 6 - 20 mg/dL 15  16  15    Creatinine 0.61 - 1.24 mg/dL 7.82  9.56  2.13   Sodium 135 - 145 mmol/L 138  139  139   Potassium 3.5 - 5.1 mmol/L 3.3  3.8  3.5   Chloride 98 - 111 mmol/L 98  99  99   CO2 22 - 32 mmol/L 29  33  30   Calcium 8.9 - 10.3 mg/dL 9.1  9.3  9.2   Total Protein 6.0 - 8.3 g/dL  6.8  7.3   Total Bilirubin 0.2 - 1.2 mg/dL  0.9  0.7   Alkaline Phos 39 - 117 U/L  36  34   AST 0 - 37 U/L  22  16   ALT 0 - 53 U/L  6  12      Radiologic Studies:  CLINICAL DATA:  Left upper quadrant abdominal pain and nausea since this morning.   EXAM: CT ABDOMEN AND PELVIS WITH CONTRAST   TECHNIQUE: Multidetector CT imaging of the abdomen and pelvis was performed using the standard protocol following bolus administration of intravenous contrast.   RADIATION DOSE REDUCTION: This exam was performed according to the departmental dose-optimization program which includes automated exposure control, adjustment of the mA and/or kV according to patient size and/or use of iterative reconstruction technique.   CONTRAST:  OMNIPAQUE IOHEXOL 300 MG/ML  SOLN   COMPARISON:  Chest CT dated 09/25/2021.   FINDINGS: Lower chest: Clear lung bases. The previously described small pulmonary nodules along the major fissure on left are not included. Normal sized heart.   Hepatobiliary: No focal liver abnormality is seen. No gallstones, gallbladder wall thickening, or biliary dilatation.   Pancreas: Unremarkable. No pancreatic ductal dilatation or surrounding inflammatory changes.   Spleen: Normal in  size without focal abnormality.   Adrenals/Urinary Tract: Normal-appearing adrenal glands. Three small right renal cysts. These do not need imaging follow-up. Unremarkable left kidney, ureters and urinary bladder.   Stomach/Bowel: Enlarged, low-density retrocecal appendix in  the lower quadrant of the right pelvis with its origin in the upper right pelvis near the iliac crest. This measures 11 mm in diameter and there is associated mild periappendiceal soft tissue stranding.   Unremarkable stomach, small bowel and colon.   Vascular/Lymphatic: No significant vascular findings are present. No enlarged abdominal or pelvic lymph nodes.   Reproductive: Mildly enlarged prostate gland.   Other: Small left inguinal and left paraumbilical hernias containing fat.   Musculoskeletal: Lower lumbar spine degenerative changes with grade 1 retrolisthesis at the L3-4 and L4-5 levels. No fractures or pars defects.   IMPRESSION: 1. Acute appendicitis without evidence of abscess. 2. Small left inguinal and left paraumbilical hernias containing fat.     Electronically Signed   By: Beckie Salts M.D.   On: 11/29/2022 12:38  Assessment: Encounter Diagnoses  Name Primary?   Other chest pain Yes   Abdominal pain, chronic, epigastric    Personal history of colonic polyps     This pain is difficult to characterize, and the clinical picture is not particularly consistent with reflux as a cause.  He has also had no improvement on acid suppression.  I think that will be difficult to prove/disprove, even with upper endoscopy.  He has not had improvement on acid suppression, so I recommended he stop that by weaning off over about the next 7 to 10 days.  Perhaps wireless pH testing placed during upper endoscopy may be able to determine if reflux events coincide with the reported symptoms, but only if he will then go running in the 48-hour period after endoscopy placement.  He feels he could do that, but probably not for another 4 to 6 weeks because he is still getting over COVID symptoms.  He is also due for surveillance colonoscopy, and we will plan to do that at the same time as the EGD.  Plan:  EGD with wireless "Bravo" pH device placement while off acid suppression  medicines, and surveillance colonoscopy.  He was agreeable after discussion of procedure and risks.  The benefits and risks of the planned procedure were described in detail with the patient or (when appropriate) their health care proxy.  Risks were outlined as including, but not limited to, bleeding, infection, perforation, adverse medication reaction leading to cardiac or pulmonary decompensation, pancreatitis (if ERCP).  The limitation of incomplete mucosal visualization was also discussed.  No guarantees or warranties were given.   Thank you for the courtesy of this consult.  Please call me with any questions or concerns.  Charlie Pitter III  CC: Referring provider noted above

## 2023-05-06 NOTE — Patient Instructions (Signed)
_______________________________________________________  If your blood pressure at your visit was 140/90 or greater, please contact your primary care physician to follow up on this.  _______________________________________________________  If you are age 54 or older, your body mass index should be between 23-30. Your Body mass index is 24.85 kg/m. If this is out of the aforementioned range listed, please consider follow up with your Primary Care Provider.  If you are age 68 or younger, your body mass index should be between 19-25. Your Body mass index is 24.85 kg/m. If this is out of the aformentioned range listed, please consider follow up with your Primary Care Provider.   ________________________________________________________  The [facility] providers would like to encourage you to use [facility] to communicate with providers for non-urgent requests or questions.  Due to long hold times on the telephone, sending your provider a message by [facility] may be a faster and more efficient way to get a response.  Please allow 48 business hours for a response.  Please remember that this is for non-urgent requests.  _______________________________________________________   Bonita Quin have been scheduled for an endoscopy/colonoscopy. Please follow written instructions given to you at your visit today.  If you use inhalers (even only as needed), please bring them with you on the day of your procedure.  If you take any of the following medications, they will need to be adjusted prior to your procedure:   DO NOT TAKE 7 DAYS PRIOR TO TEST- Trulicity (dulaglutide) Ozempic, Wegovy (semaglutide) Mounjaro (tirzepatide) Bydureon Bcise (exanatide extended release)  DO NOT TAKE 1 DAY PRIOR TO YOUR TEST Rybelsus (semaglutide) Adlyxin (lixisenatide) Victoza (liraglutide) Byetta (exanatide) ___________________________________________________________________________   The BravoT capsule test is a noninvasive  test for evaluating heartburn or reflux symptoms related to gastroesophageal reflux disease (GERD). Damage caused by GERD can lead to more serious medical problems such as difficulty swallowing (dysphagia), narrowing of the esophagus (strictures), and Barrett's esophagus.  This test involves inserting a capsule the size of a long gel cap into the esophagus to measure the pH environment. Higher levels of pH in the esophagus indicate the presence of acid reflux. Your doctor will analyze results from the Bravo test to determine what is causing your symptoms and which treatment to prescribe for you.  Patients with pacemakers, cardiac defibrillators, or diagnosed gastrointestinal obstructions or strictures should inform your physician.   Throughout the test period, which lasts 48 to 72 hours, the Bravo capsule will measure the pH in your esophagus and transmit this information to the Bravo reflux recorder, a small device that you will wear on your belt or waistband as you would a mobile phone. The capsule communicates with the recorder wirelessly, meaning that no tube or wire remains in your nose or throat.  The Bravo capsule not only measures the degree of acidity during the test period but also how often stomach acid flows into the lower esophagus.  After the capsule has been placed, you can  go about your normal activities. Some patients can feel the presence of the capsule, some do not. You will also be given a diary to note when you have reflux symptoms, when you eat and drink, and when you sleep or lie down.  Once the test has been completed, you will return the recorder and diary to the [facility]. The data is then downloaded to a computer program, which provides a comprehensive report. Your GI doctor will analyze the information and your symptoms to determining if you have acid reflux.  A day or two after the test is completed, the disposable capsule falls off the wall of your  esophagus, harmlessly passes through your digestive tract, and is eliminated from your body through a bowel movement.  You should expect to receive the results of the Bravo study in approximately 2 weeks from returning the recorder.    For 7 days prior to your test do not take: Dexilant, Prevacid, Nexium, Protonix, Aciphex, Zegerid, Pantoprazole, Prilosec or Omeprazole.  For 7 days prior to your test, do not take: Reglan, Tagamet, Zantac, Axid or Pepcid.  You MAY use an antacid such as Rolaids or Tums up to 12 hours prior to your test.   Due to recent changes in healthcare laws, you may see the results of your imaging and laboratory studies on MyChart before your provider has had a chance to review them.  We understand that in some cases there may be results that are confusing or concerning to you. Not all laboratory results come back in the same time frame and the provider may be waiting for multiple results in order to interpret others.  Please give [place] 48 hours in order for your provider to thoroughly review all the results before contacting the office for clarification of your results.   It was a pleasure to see you today!  Thank you for trusting me with your gastrointestinal care!

## 2023-05-12 MED ORDER — FLUTICASONE PROPIONATE 0.05 % EX CREA: TOPICAL_CREAM | Freq: Two times a day (BID) | CUTANEOUS | 5 refills | Status: AC

## 2023-05-12 NOTE — Telephone Encounter (Signed)
I sent in the cream  

## 2023-05-12 NOTE — Addendum Note (Signed)
Addended by: Gershon Crane A on: 05/12/2023 12:35 PM   Modules accepted: Orders

## 2023-06-23 ENCOUNTER — Telehealth: Payer: Self-pay

## 2023-06-23 ENCOUNTER — Telehealth: Payer: Self-pay | Admitting: Gastroenterology

## 2023-06-23 NOTE — Telephone Encounter (Signed)
Inbound call from patient wanting to confirm arrive time at 8:00 am for 8/27 procedures. Please advise on arrival time. Please advise, thank you

## 2023-06-23 NOTE — Telephone Encounter (Signed)
During reminder call for upcoming patient appt on 06/30/23, patient inquired about the correct time for his procedure, as two separate times are listed on patient letter as well as in appointment schedule. RN to inquire to confirm actual arrival and procedure times and notify patient accordingly.

## 2023-06-23 NOTE — Telephone Encounter (Signed)
Message forwarded to MD RN.

## 2023-06-23 NOTE — Telephone Encounter (Signed)
Called patient, appointment info has been reviewed. Confusion due to bravo device scheduled show up in my chart.

## 2023-06-24 NOTE — Telephone Encounter (Signed)
This has already been addressed. See alternate telephone encounter for details.

## 2023-06-27 ENCOUNTER — Encounter: Payer: Self-pay | Admitting: Certified Registered Nurse Anesthetist

## 2023-06-30 ENCOUNTER — Encounter: Payer: Self-pay | Admitting: Gastroenterology

## 2023-06-30 ENCOUNTER — Ambulatory Visit: Payer: 59 | Admitting: Gastroenterology

## 2023-06-30 ENCOUNTER — Ambulatory Visit (AMBULATORY_SURGERY_CENTER): Payer: 59 | Admitting: Gastroenterology

## 2023-06-30 VITALS — BP 106/77 | HR 83 | Resp 13

## 2023-06-30 VITALS — BP 125/69 | HR 73 | Temp 97.7°F | Ht 72.0 in | Wt 184.0 lb

## 2023-06-30 VITALS — BP 118/78 | HR 88 | Temp 97.7°F | Resp 14 | Ht 72.0 in | Wt 184.0 lb

## 2023-06-30 DIAGNOSIS — R12 Heartburn: Secondary | ICD-10-CM

## 2023-06-30 DIAGNOSIS — Z09 Encounter for follow-up examination after completed treatment for conditions other than malignant neoplasm: Secondary | ICD-10-CM | POA: Diagnosis not present

## 2023-06-30 DIAGNOSIS — D120 Benign neoplasm of cecum: Secondary | ICD-10-CM | POA: Diagnosis not present

## 2023-06-30 DIAGNOSIS — R0789 Other chest pain: Secondary | ICD-10-CM | POA: Diagnosis not present

## 2023-06-30 DIAGNOSIS — K219 Gastro-esophageal reflux disease without esophagitis: Secondary | ICD-10-CM | POA: Diagnosis present

## 2023-06-30 DIAGNOSIS — Z860100 Personal history of colon polyps, unspecified: Secondary | ICD-10-CM

## 2023-06-30 HISTORY — PX: ESOPHAGOGASTRODUODENOSCOPY: SHX1529

## 2023-06-30 HISTORY — PX: COLONOSCOPY: SHX174

## 2023-06-30 MED ORDER — SODIUM CHLORIDE 0.9 % IV SOLN
500.0000 mL | Freq: Once | INTRAVENOUS | Status: DC
Start: 2023-06-30 — End: 2023-06-30

## 2023-06-30 NOTE — Progress Notes (Signed)
Called to room to assist during endoscopic procedure.  Patient ID and intended procedure confirmed with present staff. Received instructions for my participation in the procedure from the performing physician.  Capsule expiration date- 09-28-24  Capsule ID number [id number]  LES measurement: 42 (capsule placed 6 cm above LES) 36  Time of implant: 1052

## 2023-06-30 NOTE — Progress Notes (Signed)
History and Physical:  This patient presents for endoscopic testing for: Encounter Diagnoses  Name Primary?   Other chest pain Yes   Personal history of colonic polyps     Clinical details in May 05, 2023 office consult note.  No changes since then. Surveillance colonoscopy for Hx polyps in 2021.  Non-cardiac exertional chest pain with question of GERD as a cause. Patient is otherwise without complaints or active issues today.   Past Medical History: Past Medical History:  Diagnosis Date   Allergic rhinitis    Allergy    Asthma    Cancer (HCC)    skin cancer- left shoulder, DFSP   GERD (gastroesophageal reflux disease)    Headache(784.0)    History of chickenpox    History of nephrolithiasis    HLD (hyperlipidemia)    Hypertension    Parkinson's disease 02/28/2022   Skin exam, screening for cancer    sees Dr. Koleen Nimrod      Past Surgical History: Past Surgical History:  Procedure Laterality Date   COLONOSCOPY  12/06/2019   per Dr. 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 Octaviano Glow and then had MOHS on 2/11/110 per DR. Albertini     2010   LAPAROSCOPIC APPENDECTOMY N/A 11/29/2022   Procedure: APPENDECTOMY LAPAROSCOPIC;  Surgeon: Violeta Gelinas, MD;  Location: [facility];  Service: General;  Laterality: N/A;   LASIK     rt achilles tendon repair  09/2000   SHOULDER SURGERY Bilateral     Allergies: No Known Allergies  Outpatient Meds: Current Outpatient Medications  Medication Sig Dispense Refill   amLODipine (NORVASC) 10 MG tablet Take 1 tablet (10 mg total) by mouth daily. 90 tablet 3   Carbidopa-Levodopa ER (SINEMET CR) 25-100 MG tablet controlled release 1 TABLET THREE TIMES A DAY AT 7AM/11AM/4PM 270 tablet 1   [employer] ALLERGY RELIEF-D 10-240 MG 24 hr tablet TAKE 1 TABLET BY MOUTH EVERY DAY 90 tablet 3   fluticasone (FLONASE) 50 MCG/ACT nasal spray USE TWO SPRAYS IN EACH NOSTRIL  DAILY  16 g 2   hydrochlorothiazide (HYDRODIURIL) 25 MG tablet Take 1 tablet (25 mg total) by mouth daily. 90 tablet 3   pramipexole (MIRAPEX) 0.5 MG tablet Take 1 tablet (0.5 mg total) by mouth 3 (three) times daily. 270 tablet 1   albuterol (VENTOLIN HFA) 108 (90 Base) MCG/ACT inhaler INHALE 2 PUFFS INTO THE LUNGS EVERY 4 HOURS AS NEEDED FOR WHEEZING OR SHORTNESS OF BREATH (BEFORE EXERCISE) 6.7 each 5   Cyanocobalamin (B-12) 2500 MCG TABS      famotidine (PEPCID) 40 MG tablet Take 1 tablet (40 mg total) by mouth daily before supper. 30 tablet 5   Fish Oil OIL by Does not apply route.     fluticasone (CUTIVATE) 0.05 % cream Apply topically 2 (two) times daily. 30 g 5   Misc Natural Products (GLUCOSAMINE CHOND COMPLEX/MSM PO) Take by mouth daily.     Multiple Vitamin (MULTIVITAMIN) tablet Take 1 tablet by mouth daily.     omeprazole (PRILOSEC) 20 MG capsule Take 2 capsules (40 mg total) by mouth in the morning. (Patient not taking: Reported on 06/30/2023) 30 capsule 3   Vitamin D, Cholecalciferol, 1000 UNITS CAPS Take by mouth daily.     Current Facility-Administered Medications  Medication Dose Route Frequency Provider Last Rate Last Admin   0.9 %  sodium chloride infusion  500 mL Intravenous Once Charlie Pitter III,  MD          ___________________________________________________________________ Objective   Exam:  BP 125/69   Pulse 73   Temp 97.7 F (36.5 C)   Ht 6' (1.829 m)   Wt 184 lb (83.5 kg)   SpO2 100%   BMI 24.95 kg/m   CV: regular , S1/S2 Resp: clear to auscultation bilaterally, normal RR and effort noted GI: soft, no tenderness, with active bowel sounds.   Assessment: Encounter Diagnoses  Name Primary?   Other chest pain Yes   Personal history of colonic polyps      Plan: Colonoscopy (surveillance ) EGD with Bravo pH study  The benefits and risks of the planned procedure were described in detail with the patient or (when appropriate) their health care  proxy.  Risks were outlined as including, but not limited to, bleeding, infection, perforation, adverse medication reaction leading to cardiac or pulmonary decompensation, pancreatitis (if ERCP).  The limitation of incomplete mucosal visualization was also discussed.  No guarantees or warranties were given.  The patient is appropriate for an endoscopic procedure in the ambulatory setting.   - Amada Jupiter, MD

## 2023-06-30 NOTE — Progress Notes (Signed)
Report given to PACU, vss 

## 2023-06-30 NOTE — Op Note (Signed)
[facility] Patient Name: Casey Allen Procedure Date: 06/30/2023 10:02 AM MRN: [id number] Endoscopist: Sherilyn Cooter L. Myrtie Allen , MD, [id number] Age: 52 Referring MD:  Date of Birth: April 27, 1971 Gender: Male Account #: [id number] Procedure:                Upper GI endoscopy Indications:              Unexplained chest pain Medicines:                Monitored Anesthesia Care Procedure:                Pre-Anesthesia Assessment:                           - Prior to the procedure, a History and Physical                            was performed, and patient medications and                            allergies were reviewed. The patient's tolerance of                            previous anesthesia was also reviewed. The risks                            and benefits of the procedure and the sedation                            options and risks were discussed with the patient.                            All questions were answered, and informed consent                            was obtained. Prior Anticoagulants: The patient has                            taken no anticoagulant or antiplatelet agents. ASA                            Grade Assessment: II - A patient with mild systemic                            disease. After reviewing the risks and benefits,                            the patient was deemed in satisfactory condition to                            undergo the procedure.                           After obtaining informed consent, the endoscope was  passed under direct vision. Throughout the                            procedure, the patient's blood pressure, pulse, and                            oxygen saturations were monitored continuously. The                            Olympus Scope [id number] was introduced through the                            mouth, and advanced to the second part of duodenum.                            The upper GI endoscopy was  accomplished without                            difficulty. The patient tolerated the procedure                            well. Scope In: Scope Out: Findings:                 The examined esophagus was normal. The BRAVO                            capsule with delivery system was introduced through                            the mouth and advanced into the esophagus, such                            that the BRAVO pH capsule was positioned 36 cm from                            the incisors, which was 6 cm proximal to the GE                            junction. Endoscopic confirmation (see photo). The                            BRAVO pH capsule was then deployed and attached to                            the esophageal mucosa. The delivery system was then                            withdrawn.Endoscopic confirmation with repeat scope                            passage. Endoscopy was utilized for probe placement  and diagnostic evaluation.                           The stomach was normal.                           The cardia and gastric fundus were normal on                            retroflexion. (Hill grade 2)                           The examined duodenum was normal. Complications:            No immediate complications. Estimated Blood Loss:     Estimated blood loss: none. Estimated blood loss:                            none. Impression:               - Normal esophagus.                           - Normal stomach.                           - Normal examined duodenum.                           - The BRAVO pH capsule was deployed.                           - No specimens collected. Recommendation:           - Patient has a contact number available for                            emergencies. The signs and symptoms of potential                            delayed complications were discussed with the                            patient. Return to normal activities  tomorrow.                            Written discharge instructions were provided to the                            patient.                           - Resume previous diet.                           - Continue present medications.                           - See the other procedure note  for documentation of                            additional recommendations. Record chest pain on                            the monitor when it occurs.                           - Review pH study report when available in 2-3                            weeks. Casey Daman L. Myrtie Neither, MD 06/30/2023 10:59:50 AM This report has been signed electronically.

## 2023-06-30 NOTE — Progress Notes (Signed)
Chart to EGD

## 2023-06-30 NOTE — Progress Notes (Signed)
1008 Robinul 0.1 mg IV given due large amount of secretions upon assessment.  MD made aware, vss 

## 2023-06-30 NOTE — Op Note (Signed)
[facility] Patient Name: Casey Allen Procedure Date: 06/30/2023 10:10 AM MRN: [id number] Endoscopist: Sherilyn Cooter L. Myrtie Neither , MD, [id number] Age: 52 Referring MD:  Date of Birth: June 25, 1971 Gender: Male Account #: [id number] Procedure:                Colonoscopy Indications:              Surveillance: Personal history of adenomatous                            polyps on last colonoscopy 3 years ago                           Feb 2021 - < 10mm TA x 2; 10mm SSP without dysplasia Medicines:                Monitored Anesthesia Care Procedure:                Pre-Anesthesia Assessment:                           - Prior to the procedure, a History and Physical                            was performed, and patient medications and                            allergies were reviewed. The patient's tolerance of                            previous anesthesia was also reviewed. The risks                            and benefits of the procedure and the sedation                            options and risks were discussed with the patient.                            All questions were answered, and informed consent                            was obtained. Prior Anticoagulants: The patient has                            taken no anticoagulant or antiplatelet agents. ASA                            Grade Assessment: II - A patient with mild systemic                            disease. After reviewing the risks and benefits,                            the patient was deemed in satisfactory condition to  undergo the procedure.                           After obtaining informed consent, the colonoscope                            was passed under direct vision. Throughout the                            procedure, the patient's blood pressure, pulse, and                            oxygen saturations were monitored continuously. The                            CF [id number] #[id number]  was introduced through the anus                            and advanced to the the cecum, identified by                            appendiceal orifice and ileocecal valve. The                            colonoscopy was somewhat difficult due to a                            redundant colon. Successful completion of the                            procedure was aided by using manual pressure and                            straightening and shortening the scope to obtain                            bowel loop reduction. The patient tolerated the                            procedure well. The quality of the bowel                            preparation was excellent. The ileocecal valve,                            appendiceal orifice, and rectum were photographed. Scope In: 10:23:48 AM Scope Out: 10:38:07 AM Scope Withdrawal Time: 0 hours 9 minutes 28 seconds  Total Procedure Duration: 0 hours 14 minutes 19 seconds  Findings:                 The perianal and digital rectal examinations were                            normal.  Repeat examination of right colon under NBI                            performed.                           A diminutive polyp was found in the ileocecal                            valve. The polyp was flat. The polyp was removed                            with a cold snare. Resection and retrieval were                            complete.                           Internal hemorrhoids were found.                           The exam was otherwise without abnormality on                            direct and retroflexion views. Complications:            No immediate complications. Estimated Blood Loss:     Estimated blood loss was minimal. Impression:               - One diminutive polyp at the ileocecal valve,                            removed with a cold snare. Resected and retrieved.                           - Internal hemorrhoids.                            - The examination was otherwise normal on direct                            and retroflexion views. Recommendation:           - Patient has a contact number available for                            emergencies. The signs and symptoms of potential                            delayed complications were discussed with the                            patient. Return to normal activities tomorrow.                            Written discharge instructions were provided to the  patient.                           - Resume previous diet.                           - Continue present medications.                           - Await pathology results.                           - Repeat colonoscopy in 5 years for surveillance.                           - See the other procedure note for documentation of                            additional recommendations. Ellington Cornia L. Myrtie Neither, MD 06/30/2023 10:42:28 AM This report has been signed electronically.

## 2023-06-30 NOTE — Patient Instructions (Addendum)
Post-op Bravo pH instructions Once you get home:  Eat normally and go about your daily routine/activities Limit drinking fluids or eating between meals Do not chew gum or eat hard candy DO NOT take any antacid or anti-reflux medications during the 48-hour monitoring time, unless instructed by your physician  Recording events: Events to be recorded are:  Record using event buttons on recorder and write on paper diary form Every time you eat or drink something (other than water) 2.   Periods of lying down/reclining 3.  Symptoms:  may include heartburn, regurgitation, chest pain, cough or specify if other.  A paper diary is also provided to record the times of your reflux symptoms and times for meals and when you lie down.  The recorder needs to remain within 3 feet (arms length) of you during the testing period (48 hours). If you should forget and move outside of a 3-foot radius of the receiver you may hear beeping and you will see a "C1" error in the display window on the top of the receiver.  Please pick up the receiver and hold close to you to re-establish the connection and the error message disappears.  You may take a bath/shower during the testing period, but the recorder must not get wet and must remain within 3 feet of you. Please leave the receiver outside of the shower or tub while bathing. The monitoring period will be for 48 hours after placement of the capsule.  At the end of the 48 hours, you will return the recorder, and your diary, to our [facility] front desk.  A nurse will meet you to collect the device and answer any questions you may have.  The device should turn off once the 48 hours is complete.   What to expect after placement of the capsule:  Some patients experience a vague sensation that something is in their esophagus or that they 'feel' the capsule when they swallow food.  Should you experience this, chewing food carefully or drinking liquids may  minimize this sensation.   After the test is complete, the disposable capsule will fall off the wall of your esophagus within 5-10 days and pass naturally with your bowel movement through the digestive tract.  Once the recorder is returned, your provider will review and interpret your recordings and contact you to discuss your results.  This may take up to two weeks.   DO NOT have an MRI for 30 days after your procedure to ensure the capsule is no longer inside your body  It is imperative that you return the recorder on THURSDAY 07/02/23 by 3:00pm.  Your information must be downloaded at this time to obtain your results.        YOU HAD AN ENDOSCOPIC PROCEDURE TODAY AT THE [facility]:   Refer to the procedure report that was given to you for any specific questions about what was found during the examination.  If the procedure report does not answer your questions, please call your gastroenterologist to clarify.  If you requested that your care partner not be given the details of your procedure findings, then the procedure report has been included in a sealed envelope for you to review at your convenience later.  YOU SHOULD EXPECT: Some feelings of bloating in the abdomen. Passage of more gas than usual.  Walking can help get rid of the air that was put into your GI tract during the procedure and reduce the bloating. If you had  a lower endoscopy (such as a colonoscopy or flexible sigmoidoscopy) you may notice spotting of blood in your stool or on the toilet paper. If you underwent a bowel prep for your procedure, you may not have a normal bowel movement for a few days.  Please Note:  You might notice some irritation and congestion in your nose or some drainage.  This is from the oxygen used during your procedure.  There is no need for concern and it should clear up in a day or so.  SYMPTOMS TO REPORT IMMEDIATELY:  Following lower endoscopy (colonoscopy or flexible  sigmoidoscopy):  Excessive amounts of blood in the stool  Significant tenderness or worsening of abdominal pains  Swelling of the abdomen that is new, acute  Fever of 100F or higher  Following upper endoscopy (EGD)  Vomiting of blood or coffee ground material  New chest pain or pain under the shoulder blades  Painful or persistently difficult swallowing  New shortness of breath  Fever of 100F or higher  Black, tarry-looking stools  For urgent or emergent issues, a gastroenterologist can be reached at any hour by calling [phone]. Do not use MyChart messaging for urgent concerns.    DIET:  We do recommend a small meal at first, but then you may proceed to your regular diet.  Drink plenty of fluids but you should avoid alcoholic beverages for 24 hours.  ACTIVITY:  You should plan to take it easy for the rest of today and you should NOT DRIVE or use heavy machinery until tomorrow (because of the sedation medicines used during the test).    FOLLOW UP: Our staff will call the number listed on your records the next business day following your procedure.  We will call around 7:15- 8:00 am to check on you and address any questions or concerns that you may have regarding the information given to you following your procedure. If we do not reach you, we will leave a message.     If any biopsies were taken you will be contacted by phone or by letter within the next 1-3 weeks.  Please call us at [phone] if you have not heard about the biopsies in 3 weeks.    SIGNATURES/CONFIDENTIALITY: You and/or your care partner have signed paperwork which will be entered into your electronic medical record.  These signatures attest to the fact that that the information above on your After Visit Summary has been reviewed and is understood.  Full responsibility of the confidentiality of this discharge information lies with you and/or your care-partner.

## 2023-07-01 ENCOUNTER — Telehealth: Payer: Self-pay

## 2023-07-01 NOTE — Telephone Encounter (Signed)
No answer on follow up call. Voicemail full.  

## 2023-07-14 ENCOUNTER — Encounter: Payer: Self-pay | Admitting: Gastroenterology

## 2023-07-16 ENCOUNTER — Encounter: Payer: Self-pay | Admitting: Gastroenterology

## 2023-07-28 ENCOUNTER — Encounter: Payer: Self-pay | Admitting: Gastroenterology

## 2023-07-29 ENCOUNTER — Encounter: Payer: Self-pay | Admitting: Gastroenterology

## 2023-07-31 ENCOUNTER — Other Ambulatory Visit: Payer: Self-pay | Admitting: Family Medicine

## 2023-07-31 DIAGNOSIS — G20A1 Parkinson's disease without dyskinesia, without mention of fluctuations: Secondary | ICD-10-CM

## 2023-08-07 NOTE — Progress Notes (Unsigned)
Assessment/Plan:   1.  Parkinsons Disease  -Has had second opinion at [facility] with Dr. Evern Bio.  -continue carbidopa/levodopa 25/100 CR tid  -continue pramipexole 0.5 mg tid.  No compulsive behaviors  -We discussed that it used to be thought that levodopa would increase risk of melanoma but now it is believed that Parkinsons itself likely increases risk of melanoma. he is to get regular skin checks.  He is doing this   -we talked about MRI brain.  I had remembered we ordered it but it wasn't done but he reminded me that we decided to hold on it d/t cost/nl exam.    2.  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  3.  Dysphagia  -we will schedule MBE.  If neg, can consider GI eval   Subjective:   Casey Allen was seen today in follow up for Parkinsons disease, diagnosed last visit.  My previous records were reviewed prior to todays visit as well as outside records available to me.  Last visit, I started pramipexole.  He emailed not long thereafter and stated that he thought it caused parasomnias.  We sent a message back stating that parasomnias are generally associated with Parkinson's disease, and not the medications used to treat it, but ultimately he asked if he could just stop the medication, which we did not object to.  He did go off of it for a few weeks but still had the bad dreams and went back on it.   He reports today that he is on the medication and is doing well.  He has bad dreams/night terrors still a few times per week (he may get up and look around the bedroom).  He has reported weight loss due to covid since last visit.  However, he feels pretty good with the weight loss.  Noting some trouble swallowing with solids and pills.  Quit taking fish oil b/c it was too big.  No problem with liquid.  Current prescribed movement disorder medications: Carbidopa/levodopa 25/100 CR, 1 tablet 3 times per day  Zofran as needed Pramipexole 0.5 mg 3 times per day  (started last visit)  PREVIOUS MEDICATIONS: carbidopa/levodopa 25/100 IR (some nausea); pramipexole (he felt it caused parasomnias)  ALLERGIES:   No Known Allergies   CURRENT MEDICATIONS:  Current Meds  Medication Sig   amLODipine (NORVASC) 10 MG tablet Take 1 tablet (10 mg total) by mouth daily.   Carbidopa-Levodopa ER (SINEMET CR) 25-100 MG tablet controlled release TAKE 1 TABLET BY MOUTH THREE TIMES A DAY AT 7AM,11AM AND 4PM   [employer] ALLERGY RELIEF-D 10-240 MG 24 hr tablet TAKE 1 TABLET BY MOUTH EVERY DAY   Cyanocobalamin (B-12) 2500 MCG TABS    Fish Oil OIL by Does not apply route.   fluticasone (CUTIVATE) 0.05 % cream Apply topically 2 (two) times daily.   fluticasone (FLONASE) 50 MCG/ACT nasal spray USE TWO SPRAYS IN EACH NOSTRIL DAILY    hydrochlorothiazide (HYDRODIURIL) 25 MG tablet Take 1 tablet (25 mg total) by mouth daily.   Misc Natural Products (GLUCOSAMINE CHOND COMPLEX/MSM PO) Take by mouth daily.   Multiple Vitamin (MULTIVITAMIN) tablet Take 1 tablet by mouth daily.   pramipexole (MIRAPEX) 0.5 MG tablet TAKE 1 TABLET BY MOUTH 3 TIMES DAILY.   Vitamin D, Cholecalciferol, 1000 UNITS CAPS Take by mouth daily.     Objective:   PHYSICAL EXAMINATION:    VITALS:   Vitals:   08/10/23 0835  BP: 126/88  Pulse: 81  SpO2: 97%  Weight: 188 lb 12.8 oz (85.6 kg)  Height: 6\' 1"  (1.854 m)   Wt Readings from Last 3 Encounters:  08/10/23 188 lb 12.8 oz (85.6 kg)  06/30/23 184 lb (83.5 kg)  06/30/23 184 lb (83.5 kg)     GEN:  The patient appears stated age and is in NAD. HEENT:  Normocephalic, atraumatic.  The mucous membranes are moist. The superficial temporal arteries are without ropiness or tenderness. CV:  RRR Lungs:   CTAB Neck:  no bruits  Neurological examination:  Orientation: The patient is alert and oriented x3.  Cranial nerves: There is good facial symmetry.  Extraocular muscles are intact. The visual fields are full to confrontational testing. The speech  is fluent and clear. Soft palate rises symmetrically and there is no tongue deviation. Hearing is intact to conversational tone. Sensation: Sensation is intact to light touch throughout  Motor: Strength is at least antigravity x 4   Movement examination: Tone: There is mild increased tone in the bilateral UE Abnormal movements: there is no rest tremor.   Coordination:  There is mild decremation with hand opening and closing on the L Gait and Station: The patient has no difficulty arising out of a deep-seated chair without the use of the hands. The patient's stride length is good.  The patient has a negative pull test.    I have reviewed and interpreted the following labs independently    Chemistry      Component Value Date/Time   NA 138 04/27/2023 0925   K 3.3 (L) 04/27/2023 0925   CL 98 04/27/2023 0925   CO2 29 04/27/2023 0925   BUN 15 04/27/2023 0925   CREATININE 1.41 (H) 04/27/2023 0925      Component Value Date/Time   CALCIUM 9.1 04/27/2023 0925   ALKPHOS 36 (L) 02/19/2023 1006   AST 22 02/19/2023 1006   ALT 6 02/19/2023 1006   BILITOT 0.9 02/19/2023 1006       Lab Results  Component Value Date   WBC 7.1 04/27/2023   HGB 13.9 04/27/2023   HCT 40.6 04/27/2023   MCV 92.3 04/27/2023   PLT 194 04/27/2023    Lab Results  Component Value Date   TSH 0.45 02/19/2023   Total time spent on today's visit was 30 minutes, including both face-to-face time and nonface-to-face time.  Time included that spent on review of records (prior notes available to me/labs/imaging if pertinent), discussing treatment and goals, answering patient's questions and coordinating care.    Cc:  Nelwyn Salisbury, MD

## 2023-08-10 ENCOUNTER — Encounter: Payer: Self-pay | Admitting: Neurology

## 2023-08-10 ENCOUNTER — Ambulatory Visit (INDEPENDENT_AMBULATORY_CARE_PROVIDER_SITE_OTHER): Payer: 59 | Admitting: Neurology

## 2023-08-10 ENCOUNTER — Other Ambulatory Visit: Payer: Self-pay

## 2023-08-10 VITALS — BP 126/88 | HR 81 | Ht 73.0 in | Wt 188.8 lb

## 2023-08-10 DIAGNOSIS — R1310 Dysphagia, unspecified: Secondary | ICD-10-CM

## 2023-08-10 DIAGNOSIS — G20A1 Parkinson's disease without dyskinesia, without mention of fluctuations: Secondary | ICD-10-CM

## 2023-08-10 DIAGNOSIS — R1319 Other dysphagia: Secondary | ICD-10-CM | POA: Diagnosis not present

## 2023-08-10 NOTE — Patient Instructions (Signed)
The physicians and staff at [facility] are committed to providing excellent care. You may receive a survey requesting feedback about your experience at our office. We strive to receive "very good" responses to the survey questions. If you feel that your experience would prevent you from giving the office a "very good " response, please contact our office to try to remedy the situation. We may be reached at [phone]. Thank you for taking the time out of your busy day to complete the survey.  

## 2023-08-11 ENCOUNTER — Other Ambulatory Visit (HOSPITAL_COMMUNITY): Payer: Self-pay | Admitting: *Deleted

## 2023-08-11 DIAGNOSIS — R1310 Dysphagia, unspecified: Secondary | ICD-10-CM

## 2023-08-14 ENCOUNTER — Ambulatory Visit: Payer: 59 | Admitting: Neurology

## 2023-08-18 ENCOUNTER — Encounter (HOSPITAL_COMMUNITY): Payer: Self-pay

## 2023-08-18 ENCOUNTER — Ambulatory Visit (HOSPITAL_COMMUNITY)
Admission: RE | Admit: 2023-08-18 | Discharge: 2023-08-18 | Disposition: A | Discharge status: Home / Self Care | Payer: 59 | Source: Ambulatory Visit | Attending: Family Medicine | Admitting: Family Medicine

## 2023-08-18 ENCOUNTER — Ambulatory Visit (HOSPITAL_COMMUNITY): Payer: 59

## 2023-08-18 DIAGNOSIS — G20A1 Parkinson's disease without dyskinesia, without mention of fluctuations: Secondary | ICD-10-CM

## 2023-08-18 DIAGNOSIS — R1310 Dysphagia, unspecified: Secondary | ICD-10-CM

## 2023-08-19 ENCOUNTER — Telehealth: Payer: Self-pay | Admitting: Neurology

## 2023-08-19 NOTE — Telephone Encounter (Signed)
Pt called in stating he got something in the mail about a provider being out of network for his barium swallow. He is going to try them and will let us know if he needs to be sent somewhere else.

## 2023-08-20 ENCOUNTER — Other Ambulatory Visit: Payer: Self-pay

## 2023-08-20 DIAGNOSIS — G20A1 Parkinson's disease without dyskinesia, without mention of fluctuations: Secondary | ICD-10-CM

## 2023-08-20 DIAGNOSIS — R1310 Dysphagia, unspecified: Secondary | ICD-10-CM

## 2023-08-21 ENCOUNTER — Encounter (HOSPITAL_COMMUNITY): Payer: 59

## 2023-08-27 ENCOUNTER — Telehealth (HOSPITAL_COMMUNITY): Payer: Self-pay | Admitting: *Deleted

## 2023-08-27 NOTE — Telephone Encounter (Signed)
Attempted to contact patient to schedule OP MBS. Left VM. RKEEL

## 2023-09-09 ENCOUNTER — Ambulatory Visit (HOSPITAL_COMMUNITY)
Admission: RE | Admit: 2023-09-09 | Discharge: 2023-09-09 | Disposition: A | Discharge status: Home / Self Care | Payer: 59 | Source: Ambulatory Visit | Attending: Family Medicine | Admitting: Family Medicine

## 2023-09-09 ENCOUNTER — Ambulatory Visit (HOSPITAL_COMMUNITY)
Admission: RE | Admit: 2023-09-09 | Discharge: 2023-09-09 | Disposition: A | Discharge status: Home / Self Care | Payer: 59 | Source: Ambulatory Visit | Attending: *Deleted | Admitting: *Deleted

## 2023-09-09 DIAGNOSIS — R1310 Dysphagia, unspecified: Secondary | ICD-10-CM | POA: Diagnosis not present

## 2023-09-09 DIAGNOSIS — K219 Gastro-esophageal reflux disease without esophagitis: Secondary | ICD-10-CM | POA: Insufficient documentation

## 2023-09-09 DIAGNOSIS — I10 Essential (primary) hypertension: Secondary | ICD-10-CM | POA: Diagnosis not present

## 2023-09-09 DIAGNOSIS — G20A1 Parkinson's disease without dyskinesia, without mention of fluctuations: Secondary | ICD-10-CM | POA: Diagnosis present

## 2023-10-07 ENCOUNTER — Ambulatory Visit: Payer: 59 | Admitting: Gastroenterology

## 2023-10-07 ENCOUNTER — Encounter: Payer: Self-pay | Admitting: Gastroenterology

## 2023-10-07 VITALS — BP 122/78 | HR 80 | Ht 73.0 in | Wt 190.0 lb

## 2023-10-07 DIAGNOSIS — K219 Gastro-esophageal reflux disease without esophagitis: Secondary | ICD-10-CM | POA: Diagnosis not present

## 2023-10-07 DIAGNOSIS — K641 Second degree hemorrhoids: Secondary | ICD-10-CM

## 2023-10-07 DIAGNOSIS — K648 Other hemorrhoids: Secondary | ICD-10-CM

## 2023-10-07 NOTE — Progress Notes (Signed)
[facility] Progress Note  Chief Complaint: GERD and symptomatic hemorrhoids  Subjective  Prior history  Casey Allen saw me several months ago for noncardiac chest pain with a question of reflux.  It was unusual history where he would only experience the chest pain after running for certain period of time, but not typically with other strenuous activities.  EGD with Bravo pH study was performed, results confirmed significant GERD and abnormal acid exposure in both the upright and supine positions.  He had a positive SAP and SI at nearly 100% (for symptom occurrences during the study)  Results were conveyed to him afterward by portal message, and he decided to give some consideration to an antireflux procedure such as a TIF.  He felt that his main goal was to figure out the cause of the chest pain while he was running, and was uncertain if the risks and cost of a procedure would be worth the symptom relief it might afford.  He then contacted me with some concerns regarding the small internal hemorrhoids that were noted on colonoscopy.  He was describing what sounds like some prolapse without bleeding and wondered if there might be amenable to hemorrhoidal banding.   Discussed the use of AI scribe software for clinical note transcription with the patient, who gave verbal consent to proceed.  History of Present Illness          Casey Allen wanted to discuss some tissue protrusion and discomfort in the rectal area that has been bothering him for a few years, but more prominent in the last several months.  If he stands for prolonged period and particularly if he is active such as doing yard work, he will feel a tissue protrusion that may also be accompanied by a need for a bowel movement.  If it persists then that area becomes irritated but does not bleed.  He may then have to stop what he was doing, go to the bathroom and push that tissue back up.  He is taking fiber supplement twice a day to help regulate  his bowel habits, and with that he will often feel the need for BM 1-2 times within a relatively short period of time after a bowel movement, sometimes with a feeling of urgency.  He denies abdominal pain, says his appetite is good and weight stable.  ROS: Cardiovascular:  no chest pain Respiratory: no dyspnea  The patient's Past Medical, Family and Social History were reviewed and are on file in the EMR. Past Medical History:  Diagnosis Date   Allergic rhinitis    Allergy    Asthma    Cancer (HCC)    skin cancer- left shoulder, DFSP   GERD (gastroesophageal reflux disease)    Headache(784.0)    History of chickenpox    History of nephrolithiasis    HLD (hyperlipidemia)    Hypertension    Parkinson's disease (HCC) 02/28/2022   Skin exam, screening for cancer    sees Dr. Koleen Nimrod     Past Surgical History:  Procedure Laterality Date   COLONOSCOPY  12/06/2019   per Dr. 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 Octaviano Glow and then had MOHS on 2/11/110 per DR. Albertini     2010   LAPAROSCOPIC APPENDECTOMY N/A 11/29/2022   Procedure: APPENDECTOMY LAPAROSCOPIC;  Surgeon: Violeta Gelinas, MD;  Location: [facility] OR;  Service: General;  Laterality: N/A;   LASIK  rt achilles tendon repair  09/2000   SHOULDER SURGERY Bilateral      Objective:  Med list reviewed  Current Outpatient Medications:    amLODipine (NORVASC) 10 MG tablet, Take 1 tablet (10 mg total) by mouth daily., Disp: 90 tablet, Rfl: 3   Carbidopa-Levodopa ER (SINEMET CR) 25-100 MG tablet controlled release, TAKE 1 TABLET BY MOUTH THREE TIMES A DAY AT 7AM,11AM AND 4PM, Disp: 270 tablet, Rfl: 1   [employer] ALLERGY RELIEF-D 10-240 MG 24 hr tablet, TAKE 1 TABLET BY MOUTH EVERY DAY, Disp: 90 tablet, Rfl: 3   Cyanocobalamin (B-12) 2500 MCG TABS, , Disp: , Rfl:    Fish Oil OIL, by Does not apply route., Disp: , Rfl:    fluticasone (CUTIVATE)  0.05 % cream, Apply topically 2 (two) times daily., Disp: 30 g, Rfl: 5   fluticasone (FLONASE) 50 MCG/ACT nasal spray, USE TWO SPRAYS IN EACH NOSTRIL DAILY , Disp: 16 g, Rfl: 2   hydrochlorothiazide (HYDRODIURIL) 25 MG tablet, Take 1 tablet (25 mg total) by mouth daily., Disp: 90 tablet, Rfl: 3   Misc Natural Products (GLUCOSAMINE CHOND COMPLEX/MSM PO), Take by mouth daily., Disp: , Rfl:    Multiple Vitamin (MULTIVITAMIN) tablet, Take 1 tablet by mouth daily., Disp: , Rfl:    pramipexole (MIRAPEX) 0.5 MG tablet, TAKE 1 TABLET BY MOUTH 3 TIMES DAILY., Disp: 270 tablet, Rfl: 1   Vitamin D, Cholecalciferol, 1000 UNITS CAPS, Take by mouth daily., Disp: , Rfl:    Vital signs in last 24 hrs: Vitals:   10/07/23 0826  BP: 122/78  Pulse: 80   Wt Readings from Last 3 Encounters:  10/07/23 190 lb (86.2 kg)  08/10/23 188 lb 12.8 oz (85.6 kg)  06/30/23 184 lb (83.5 kg)    Physical Exam  Well-appearing Cardiac: Regular without appreciable murmur,  no peripheral edema Pulm: clear to auscultation bilaterally, normal RR and effort noted Abdomen: soft, no tenderness, with active bowel sounds. No guarding or palpable hepatosplenomegaly. Normal perianal exam, healthy skin DRE with normal resting and voluntary sphincter tone.  No visible prolapse when he was bearing down.  Anoscopy revealed chronically swollen internal hemorrhoidal plexus, primarily right-sided.  With withdrawal of the anoscope, there was prolapse that required manual reduction. That prolapsed tissue appears mildly inflamed on the proximal side of the anal verge, no bleeding occurred.  Labs:   ___________________________________________ Radiologic studies:   ____________________________________________ Other:   _____________________________________________   Encounter Diagnoses  Name Primary?   Gastroesophageal reflux disease without esophagitis Yes   Prolapsed hemorrhoids    Intermittent reflux with running, causes  noncardiac chest pain.  He is satisfied to have had an identifiable answer for this, and does not currently feel inclined to undergo an endoscopic or surgical antireflux procedure.  Prolapsed internal hemorrhoids that are more prominent on exam today than they were at time of colonoscopy (which is often the case because the colonoscope may be reducing the hemorrhoids and the insufflation may make them less prominent). They are frequently symptomatic, albeit not with bleeding.  We discussed options for hemorrhoidal banding or evaluation by colorectal surgery for hemorrhoidectomy.  Though there is a significant prolapse of this area which might limit the utility of banding, I recommended he at least try hemorrhoidal banding to see if it may relieve the symptoms and thus preclude the need for surgery.  He would like to proceed with that after discussion of the procedure and its risks along with diagrams of the anatomy as part of the  discussion.  We decided to proceed with first hemorrhoidal banding session today, we will do another in about 2 weeks.  PROCEDURE NOTE: The patient presents with symptomatic grade 2  hemorrhoids, requesting rubber band ligation of his/her hemorrhoidal disease.  All risks, benefits and alternative forms of therapy were described and informed consent was obtained.  The anorectum was pre-medicated with 0.125% NTG and lubricant. The decision was made to band the RP internal hemorrhoids, and the [facility]'Regan System was used to perform band ligation without complication.  Digital anorectal examination was then performed to assure proper positioning of the band, and to adjust the banded tissue as required.  The patient was discharged home without pain or other issues.  Dietary and behavioral recommendations were given and along with follow-up instructions.     The following adjunctive treatments were recommended:  None.  Continue current regimen of daily fiber to help regulate  bowel movements.  The patient will return 2 weeks for  follow-up and possible additional banding as required. No complications were encountered and the patient tolerated the procedure well.  Amada Jupiter, MD    35 minutes were spent on this encounter (including chart review, history/exam, counseling/coordination of care, and documentation) > 50% of that time was spent on counseling and coordination of care.   Casey Allen

## 2023-10-07 NOTE — Patient Instructions (Signed)
HEMORRHOID BANDING PROCEDURE    FOLLOW-UP CARE   The procedure you have had should have been relatively painless since the banding of the area involved does not have nerve endings and there is no pain sensation.  The rubber band cuts off the blood supply to the hemorrhoid and the band may fall off as soon as 48 hours after the banding (the band may occasionally be seen in the toilet bowl following a bowel movement). You may notice a temporary feeling of fullness in the rectum which should respond adequately to plain Tylenol or Motrin.  Following the banding, avoid strenuous exercise that evening and resume full activity the next day.  A sitz bath (soaking in a warm tub) or bidet is soothing, and can be useful for cleansing the area after bowel movements.     To avoid constipation, take two tablespoons of natural wheat bran, natural oat bran, flax, Benefiber or any over the counter fiber supplement and increase your water intake to 7-8 glasses daily.    Unless you have been prescribed anorectal medication, do not put anything inside your rectum for two weeks: No suppositories, enemas, fingers, etc.  Occasionally, you may have more bleeding than usual after the banding procedure.  This is often from the untreated hemorrhoids rather than the treated one.  Don't be concerned if there is a tablespoon or so of blood.  If there is more blood than this, lie flat with your bottom higher than your head and apply an ice pack to the area. If the bleeding does not stop within a half an hour or if you feel faint, call our office at [phone] or go to the emergency room.  Problems are not common; however, if there is a substantial amount of bleeding, severe pain, chills, fever or difficulty passing urine (very rare) or other problems, you should call us at [phone] or report to the nearest emergency room.  Do not stay seated continuously for more than 2-3 hours for a day or two after the procedure.   Tighten your buttock muscles 10-15 times every two hours and take 10-15 deep breaths every 1-2 hours.  Do not spend more than a few minutes on the toilet if you cannot empty your bowel; instead re-visit the toilet at a later time.     _______________________________________________________  If your blood pressure at your visit was 140/90 or greater, please contact your primary care physician to follow up on this.  _______________________________________________________  If you are age 76 or older, your body mass index should be between 23-30. Your Body mass index is 25.07 kg/m. If this is out of the aforementioned range listed, please consider follow up with your Primary Care Provider.  If you are age 8 or younger, your body mass index should be between 19-25. Your Body mass index is 25.07 kg/m. If this is out of the aformentioned range listed, please consider follow up with your Primary Care Provider.   ________________________________________________________  The [facility] providers would like to encourage you to use [facility] to communicate with providers for non-urgent requests or questions.  Due to long hold times on the telephone, sending your provider a message by [facility] may be a faster and more efficient way to get a response.  Please allow 48 business hours for a response.  Please remember that this is for non-urgent requests.  _______________________________________________________ It was a pleasure to see you today!  Thank you for trusting me with your gastrointestinal care!

## 2023-10-22 ENCOUNTER — Encounter: Payer: Self-pay | Admitting: Gastroenterology

## 2023-10-22 ENCOUNTER — Ambulatory Visit: Payer: 59 | Admitting: Gastroenterology

## 2023-10-22 VITALS — BP 130/62 | Wt 192.0 lb

## 2023-10-22 DIAGNOSIS — K641 Second degree hemorrhoids: Secondary | ICD-10-CM

## 2023-10-22 DIAGNOSIS — K648 Other hemorrhoids: Secondary | ICD-10-CM

## 2023-10-22 NOTE — Patient Instructions (Signed)
HEMORRHOID BANDING PROCEDURE    FOLLOW-UP CARE   The procedure you have had should have been relatively painless since the banding of the area involved does not have nerve endings and there is no pain sensation.  The rubber band cuts off the blood supply to the hemorrhoid and the band may fall off as soon as 48 hours after the banding (the band may occasionally be seen in the toilet bowl following a bowel movement). You may notice a temporary feeling of fullness in the rectum which should respond adequately to plain Tylenol or Motrin.  Following the banding, avoid strenuous exercise that evening and resume full activity the next day.  A sitz bath (soaking in a warm tub) or bidet is soothing, and can be useful for cleansing the area after bowel movements.     To avoid constipation, take two tablespoons of natural wheat bran, natural oat bran, flax, Benefiber or any over the counter fiber supplement and increase your water intake to 7-8 glasses daily.    Unless you have been prescribed anorectal medication, do not put anything inside your rectum for two weeks: No suppositories, enemas, fingers, etc.  Occasionally, you may have more bleeding than usual after the banding procedure.  This is often from the untreated hemorrhoids rather than the treated one.  Don't be concerned if there is a tablespoon or so of blood.  If there is more blood than this, lie flat with your bottom higher than your head and apply an ice pack to the area. If the bleeding does not stop within a half an hour or if you feel faint, call our office at [phone] or go to the emergency room.  Problems are not common; however, if there is a substantial amount of bleeding, severe pain, chills, fever or difficulty passing urine (very rare) or other problems, you should call us at [phone] or report to the nearest emergency room.  Do not stay seated continuously for more than 2-3 hours for a day or two after the procedure.   Tighten your buttock muscles 10-15 times every two hours and take 10-15 deep breaths every 1-2 hours.  Do not spend more than a few minutes on the toilet if you cannot empty your bowel; instead re-visit the toilet at a later time.    _______________________________________________________  If your blood pressure at your visit was 140/90 or greater, please contact your primary care physician to follow up on this.  _______________________________________________________  If you are age 54 or older, your body mass index should be between 23-30. Your Body mass index is 25.33 kg/m. If this is out of the aforementioned range listed, please consider follow up with your Primary Care Provider.  If you are age 54 or younger, your body mass index should be between 19-25. Your Body mass index is 25.33 kg/m. If this is out of the aformentioned range listed, please consider follow up with your Primary Care Provider.   ________________________________________________________  The [facility] providers would like to encourage you to use [facility] to communicate with providers for non-urgent requests or questions.  Due to long hold times on the telephone, sending your provider a message by [facility] may be a faster and more efficient way to get a response.  Please allow 48 business hours for a response.  Please remember that this is for non-urgent requests.  _______________________________________________________ It was a pleasure to see you today!  Thank you for trusting me with your gastrointestinal care!

## 2023-10-22 NOTE — Progress Notes (Signed)
PROCEDURE NOTE: The patient presents with symptomatic grade 2  hemorrhoids, requesting rubber band ligation of his/her hemorrhoidal disease.  All risks, benefits and alternative forms of therapy were described and informed consent was obtained.  DRE revealed: nml   The anorectum was pre-medicated with 0.125% NTG and lubricant. The decision was made to band the RA internal hemorrhoids, and the [place] was used to perform band ligation without complication.  Digital anorectal examination was then performed to assure proper positioning of the band, and to adjust the banded tissue as required.  The patient was discharged home without pain or other issues.  Dietary and behavioral recommendations were given and along with follow-up instructions.     The following adjunctive treatments were recommended:  none  The patient will return 3-4 weeks  for  follow-up and possible additional banding as required. No complications were encountered and the patient tolerated the procedure well.  Wilfrid Lund, MD

## 2023-11-24 ENCOUNTER — Encounter: Payer: Self-pay | Admitting: Gastroenterology

## 2023-11-24 ENCOUNTER — Ambulatory Visit (INDEPENDENT_AMBULATORY_CARE_PROVIDER_SITE_OTHER): Payer: 59 | Admitting: Gastroenterology

## 2023-11-24 VITALS — BP 120/74 | HR 72 | Ht 73.0 in | Wt 196.4 lb

## 2023-11-24 DIAGNOSIS — K641 Second degree hemorrhoids: Secondary | ICD-10-CM | POA: Diagnosis not present

## 2023-11-24 DIAGNOSIS — K648 Other hemorrhoids: Secondary | ICD-10-CM

## 2023-11-24 NOTE — Patient Instructions (Signed)
HEMORRHOID BANDING PROCEDURE    FOLLOW-UP CARE   The procedure you have had should have been relatively painless since the banding of the area involved does not have nerve endings and there is no pain sensation.  The rubber band cuts off the blood supply to the hemorrhoid and the band may fall off as soon as 48 hours after the banding (the band may occasionally be seen in the toilet bowl following a bowel movement). You may notice a temporary feeling of fullness in the rectum which should respond adequately to plain Tylenol or Motrin.  Following the banding, avoid strenuous exercise that evening and resume full activity the next day.  A sitz bath (soaking in a warm tub) or bidet is soothing, and can be useful for cleansing the area after bowel movements.     To avoid constipation, take two tablespoons of natural wheat bran, natural oat bran, flax, Benefiber or any over the counter fiber supplement and increase your water intake to 7-8 glasses daily.    Unless you have been prescribed anorectal medication, do not put anything inside your rectum for two weeks: No suppositories, enemas, fingers, etc.  Occasionally, you may have more bleeding than usual after the banding procedure.  This is often from the untreated hemorrhoids rather than the treated one.  Don't be concerned if there is a tablespoon or so of blood.  If there is more blood than this, lie flat with your bottom higher than your head and apply an ice pack to the area. If the bleeding does not stop within a half an hour or if you feel faint, call our office at [phone] or go to the emergency room.  Problems are not common; however, if there is a substantial amount of bleeding, severe pain, chills, fever or difficulty passing urine (very rare) or other problems, you should call us at [phone] or report to the nearest emergency room.  Do not stay seated continuously for more than 2-3 hours for a day or two after the procedure.   Tighten your buttock muscles 10-15 times every two hours and take 10-15 deep breaths every 1-2 hours.  Do not spend more than a few minutes on the toilet if you cannot empty your bowel; instead re-visit the toilet at a later time.    

## 2023-11-24 NOTE — Progress Notes (Signed)
PROCEDURE NOTE: The patient presents with symptomatic grade 2  hemorrhoids, requesting rubber band ligation of his/her hemorrhoidal disease.  All risks, benefits and alternative forms of therapy were described and informed consent was obtained.  DRE revealed: nml except small anterior redundant skin fold   The anorectum was pre-medicated with 0.125% NTG and lubricant. The decision was made to band the LL internal hemorrhoids, and the [facility] O'Regan System was used to perform band ligation without complication.  Digital anorectal examination was then performed to assure proper positioning of the band, and to adjust the banded tissue as required.  The patient was discharged home without pain or other issues.  Dietary and behavioral recommendations were given and along with follow-up instructions.     The following adjunctive treatments were recommended:  none  The patient will return prn  for  follow-up and possible additional banding as required. No complications were encountered and the patient tolerated the procedure well.  Amada Jupiter, MD

## 2024-02-01 ENCOUNTER — Other Ambulatory Visit: Payer: Self-pay | Admitting: Family Medicine

## 2024-02-01 DIAGNOSIS — G20A1 Parkinson's disease without dyskinesia, without mention of fluctuations: Secondary | ICD-10-CM

## 2024-02-06 NOTE — Progress Notes (Unsigned)
 Assessment/Plan:   1.  Parkinsons Disease  -Has had second opinion at [facility] with Dr. Evern Bio.  -continue carbidopa/levodopa 25/100 CR tid  -continue pramipexole 0.5 mg tid.  No compulsive behaviors  -We discussed that it used to be thought that levodopa would increase risk of melanoma but now it is believed that Parkinsons itself likely increases risk of melanoma. he is to get regular skin checks.  He is doing this   -we talked about MRI brain.  I had remembered we ordered it but it wasn't done but he reminded me that we decided to hold on it d/t cost/nl exam.    2.  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  3.  Dysphagia  - Modified barium swallow at the end of 2024 fairly unremarkable for mature Parkinson's standpoint.  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  Patient does follow with Dr. Myrtie Neither  Subjective:   Casey Allen was seen today in follow up for Parkinsons disease, diagnosed last visit.  My previous records were reviewed prior to todays visit as well as outside records available to me.  He had a modified barium swallow done since our last visit.  This was essentially unremarkable from a pure Parkinson's standpoint.  Part of the barium tablet did get lodged midesophagus.  He did see GI almost immediately after the MBE but this was really not for this, and really they discussed hemorrhoidal banding at that visit.  Current prescribed movement disorder medications: Carbidopa/levodopa 25/100 CR, 1 tablet 3 times per day  Zofran as needed Pramipexole 0.5 mg 3 times per day   PREVIOUS MEDICATIONS: carbidopa/levodopa 25/100 IR (some nausea); pramipexole   ALLERGIES:   No Known Allergies   CURRENT MEDICATIONS:  No outpatient medications have been marked as taking for the 02/09/24 encounter (Appointment) with Lanora Reveron, Octaviano Batty, DO.     Objective:   PHYSICAL EXAMINATION:    VITALS:   There were no vitals filed for this  visit.  Wt Readings from Last 3 Encounters:  11/24/23 196 lb 6.4 oz (89.1 kg)  10/22/23 192 lb (87.1 kg)  10/07/23 190 lb (86.2 kg)     GEN:  The patient appears stated age and is in NAD. HEENT:  Normocephalic, atraumatic.  The mucous membranes are moist. The superficial temporal arteries are without ropiness or tenderness. CV:  RRR Lungs:   CTAB Neck:  no bruits  Neurological examination:  Orientation: The patient is alert and oriented x3.  Cranial nerves: There is good facial symmetry.  Extraocular muscles are intact. The visual fields are full to confrontational testing. The speech is fluent and clear. Soft palate rises symmetrically and there is no tongue deviation. Hearing is intact to conversational tone. Sensation: Sensation is intact to light touch throughout  Motor: Strength is at least antigravity x 4   Movement examination: Tone: There is mild increased tone in the bilateral UE Abnormal movements: there is no rest tremor.   Coordination:  There is mild decremation with hand opening and closing on the L Gait and Station: The patient has no difficulty arising out of a deep-seated chair without the use of the hands. The patient's stride length is good.  The patient has a negative pull test.    I have reviewed and interpreted the following labs independently    Chemistry      Component Value Date/Time   NA 138 04/27/2023 0925   K 3.3 (L) 04/27/2023 1610  CL 98 04/27/2023 0925   CO2 29 04/27/2023 0925   BUN 15 04/27/2023 0925   CREATININE 1.41 (H) 04/27/2023 0925      Component Value Date/Time   CALCIUM 9.1 04/27/2023 0925   ALKPHOS 36 (L) 02/19/2023 1006   AST 22 02/19/2023 1006   ALT 6 02/19/2023 1006   BILITOT 0.9 02/19/2023 1006       Lab Results  Component Value Date   WBC 7.1 04/27/2023   HGB 13.9 04/27/2023   HCT 40.6 04/27/2023   MCV 92.3 04/27/2023   PLT 194 04/27/2023    Lab Results  Component Value Date   TSH 0.45 02/19/2023   Total  time spent on today's visit was *** minutes, including both face-to-face time and nonface-to-face time.  Time included that spent on review of records (prior notes available to me/labs/imaging if pertinent), discussing treatment and goals, answering patient's questions and coordinating care.    Cc:  Nelwyn Salisbury, MD

## 2024-02-09 ENCOUNTER — Encounter: Payer: Self-pay | Admitting: Neurology

## 2024-02-09 ENCOUNTER — Ambulatory Visit: Payer: 59 | Admitting: Neurology

## 2024-02-09 VITALS — BP 126/74 | HR 81 | Ht 74.0 in | Wt 201.4 lb

## 2024-02-09 DIAGNOSIS — G20A1 Parkinson's disease without dyskinesia, without mention of fluctuations: Secondary | ICD-10-CM | POA: Diagnosis not present

## 2024-02-09 NOTE — Patient Instructions (Signed)

## 2024-03-16 ENCOUNTER — Ambulatory Visit: Payer: 59 | Admitting: Dermatology

## 2024-03-20 ENCOUNTER — Other Ambulatory Visit: Payer: Self-pay | Admitting: Internal Medicine

## 2024-04-30 ENCOUNTER — Other Ambulatory Visit: Payer: Self-pay | Admitting: Family Medicine

## 2024-06-10 ENCOUNTER — Encounter: Payer: Self-pay | Admitting: Family Medicine

## 2024-06-10 ENCOUNTER — Ambulatory Visit (INDEPENDENT_AMBULATORY_CARE_PROVIDER_SITE_OTHER): Admitting: Family Medicine

## 2024-06-10 VITALS — BP 118/72 | HR 82 | Temp 97.9°F | Ht 73.0 in | Wt 197.0 lb

## 2024-06-10 DIAGNOSIS — Z0000 Encounter for general adult medical examination without abnormal findings: Secondary | ICD-10-CM

## 2024-06-10 LAB — BASIC METABOLIC PANEL WITH GFR
BUN: 14 mg/dL (ref 6–23)
CO2: 31 meq/L (ref 19–32)
Calcium: 9.1 mg/dL (ref 8.4–10.5)
Chloride: 100 meq/L (ref 96–112)
Creatinine, Ser: 1.34 mg/dL (ref 0.40–1.50)
GFR: 59.79 mL/min — ABNORMAL LOW (ref >60.00)
Glucose, Bld: 99 mg/dL (ref 70–99)
Potassium: 3.7 meq/L (ref 3.5–5.1)
Sodium: 142 meq/L (ref 135–145)

## 2024-06-10 LAB — CBC WITH DIFFERENTIAL/PLATELET
Basophils Absolute: 0 K/uL (ref 0.0–0.1)
Basophils Relative: 0.6 % (ref 0.0–3.0)
Eosinophils Absolute: 0.1 K/uL (ref 0.0–0.7)
Eosinophils Relative: 2.1 % (ref 0.0–5.0)
HCT: 40.6 % (ref 39.0–52.0)
Hemoglobin: 13.6 g/dL (ref 13.0–17.0)
Lymphocytes Relative: 24.2 % (ref 12.0–46.0)
Lymphs Abs: 1.2 K/uL (ref 0.7–4.0)
MCHC: 33.5 g/dL (ref 30.0–36.0)
MCV: 92.5 fl (ref 78.0–100.0)
Monocytes Absolute: 0.5 K/uL (ref 0.1–1.0)
Monocytes Relative: 9.1 % (ref 3.0–12.0)
Neutro Abs: 3.3 K/uL (ref 1.4–7.7)
Neutrophils Relative %: 64 % (ref 43.0–77.0)
Platelets: 218 K/uL (ref 150.0–400.0)
RBC: 4.39 Mil/uL (ref 4.22–5.81)
RDW: 13.5 % (ref 11.5–15.5)
WBC: 5.1 K/uL (ref 4.0–10.5)

## 2024-06-10 LAB — HEPATIC FUNCTION PANEL
ALT: 5 U/L (ref 0–53)
AST: 18 U/L (ref 0–37)
Albumin: 4.5 g/dL (ref 3.5–5.2)
Alkaline Phosphatase: 40 U/L (ref 39–117)
Bilirubin, Direct: 0.1 mg/dL (ref 0.0–0.3)
Total Bilirubin: 0.7 mg/dL (ref 0.2–1.2)
Total Protein: 6.8 g/dL (ref 6.0–8.3)

## 2024-06-10 LAB — LIPID PANEL
Cholesterol: 215 mg/dL — ABNORMAL HIGH (ref 0–200)
HDL: 79 mg/dL (ref >39.00)
LDL Cholesterol: 127 mg/dL — ABNORMAL HIGH (ref 0–99)
NonHDL: 135.86
Total CHOL/HDL Ratio: 3
Triglycerides: 45 mg/dL (ref 0.0–149.0)
VLDL: 9 mg/dL (ref 0.0–40.0)

## 2024-06-10 LAB — HEMOGLOBIN A1C: Hgb A1c MFr Bld: 6 % (ref 4.6–6.5)

## 2024-06-10 LAB — TSH: TSH: 0.55 u[IU]/mL (ref 0.35–5.50)

## 2024-06-10 LAB — PSA: PSA: 4.62 ng/mL — ABNORMAL HIGH (ref 0.10–4.00)

## 2024-06-10 MED ORDER — CVS ALLERGY RELIEF-D 10-240 MG PO TB24: 1.0000 | ORAL_TABLET | Freq: Every day | ORAL | 3 refills | Status: AC

## 2024-06-10 NOTE — Progress Notes (Signed)
 Subjective:    Patient ID: Casey Allen, male    DOB: [birth year], 53 y.o.   MRN: [id number]  HPI Here for a well exam. He has a few issues to discuss. He sees Dr. Evonnie regularly for his Parkinson's disease. He says it is progressing but only slowly. He exercises daily, and this helps quite a bit. He says his scalp started itching a few months ago. No flaking. He has tried Neutragena shampoo with no relief. Second his ankles began to swell a little a month or so ago. This goes down at night. This is not painful. No SOB. Third for the past 6 months has been experiencing sugar crashes which he describes as suddenly feeling weak and shaky about 10-15 minutes after he eats much food with carbs in it. He has reduced the carbs in his diet, and this has helped. Fourth he notes some swollen veins in his lower legs the past few months. These are not painful    Review of Systems  Constitutional: Negative.   HENT: Negative.    Eyes: Negative.   Respiratory: Negative.    Cardiovascular:  Positive for leg swelling.  Gastrointestinal: Negative.   Genitourinary: Negative.   Musculoskeletal: Negative.   Skin: Negative.   Neurological: Negative.   Psychiatric/Behavioral: Negative.         Objective:   Physical Exam Constitutional:      General: He is not in acute distress.    Appearance: Normal appearance. He is well-developed. He is not diaphoretic.  HENT:     Head: Normocephalic and atraumatic.     Right Ear: External ear normal.     Left Ear: External ear normal.     Nose: Nose normal.     Mouth/Throat:     Pharynx: No oropharyngeal exudate.  Eyes:     General: No scleral icterus.       Right eye: No discharge.        Left eye: No discharge.     Conjunctiva/sclera: Conjunctivae normal.     Pupils: Pupils are equal, round, and reactive to light.  Neck:     Thyroid : No thyromegaly.     Vascular: No JVD.     Trachea: No tracheal deviation.  Cardiovascular:     Rate and Rhythm:  Normal rate and regular rhythm.     Pulses: Normal pulses.     Heart sounds: Normal heart sounds. No murmur heard.    No friction rub. No gallop.  Pulmonary:     Effort: Pulmonary effort is normal. No respiratory distress.     Breath sounds: Normal breath sounds. No wheezing or rales.  Chest:     Chest wall: No tenderness.  Abdominal:     General: Bowel sounds are normal. There is no distension.     Palpations: Abdomen is soft. There is no mass.     Tenderness: There is no abdominal tenderness. There is no guarding or rebound.  Genitourinary:    Penis: Normal. No tenderness.      Testes: Normal.     Prostate: Normal.     Rectum: Normal. Guaiac result negative.  Musculoskeletal:        General: No tenderness. Normal range of motion.     Cervical back: Neck supple.     Comments: Trace edema in both ankles. Both lower legs have numerous varicose veins and telangectasias. No tenderness   Lymphadenopathy:     Cervical: No cervical adenopathy.  Skin:    General: Skin  is warm and dry.     Coloration: Skin is not pale.     Findings: No erythema or rash.     Comments: His scalp is clear   Neurological:     Mental Status: He is alert and oriented to person, place, and time. Mental status is at baseline.     Motor: No abnormal muscle tone.     Deep Tendon Reflexes: Reflexes are normal and symmetric.  Psychiatric:        Mood and Affect: Mood normal.        Behavior: Behavior normal.        Thought Content: Thought content normal.        Judgment: Judgment normal.           Assessment & Plan:  Well exam. Get fasting labs. He may have some early seborrhea on the scalp so I suggested he try Selsun Blue shampoo. His mild ankle swelling is likely a side effect of the Amlodipine . Since his HTN is stable, he decided to simply monitor this for now. He can wear compression stockings while exercising. He seems to be having reactive hypoglycemic episodes. I offered to set up a glucose  tolerance test, but he declined saying he will try to limit his carb intake. Finally he has varicose veins. Since these are asymptomatic, we will observe only for now.  Garnette Olmsted, MD

## 2024-06-13 ENCOUNTER — Ambulatory Visit: Payer: Self-pay | Admitting: Family Medicine

## 2024-06-13 DIAGNOSIS — R972 Elevated prostate specific antigen [PSA]: Secondary | ICD-10-CM

## 2024-08-01 ENCOUNTER — Other Ambulatory Visit: Payer: Self-pay | Admitting: Family Medicine

## 2024-08-01 DIAGNOSIS — G20A1 Parkinson's disease without dyskinesia, without mention of fluctuations: Secondary | ICD-10-CM

## 2024-08-17 ENCOUNTER — Encounter: Payer: Self-pay | Admitting: Family Medicine

## 2024-08-19 MED ORDER — KETOCONAZOLE 2 % EX SHAM: 1.0000 | MEDICATED_SHAMPOO | CUTANEOUS | 5 refills | Status: AC

## 2024-08-19 NOTE — Telephone Encounter (Signed)
 I sent in a RX for Ketoconazole shampoo

## 2024-08-29 NOTE — Progress Notes (Unsigned)
 Assessment/Plan:   1.  Parkinsons Disease  -Has had second opinion at [facility] with Dr. Lori.  -continue carbidopa /levodopa  25/100 CR tid  -continue pramipexole  0.5 mg tid.  No compulsive behaviors  -We discussed that it used to be thought that levodopa  would increase risk of melanoma but now it is believed that Parkinsons itself likely increases risk of melanoma. he is to get regular skin checks.  He is doing this     2.  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  3.  Dysphagia  - Modified barium swallow at the end of 2024 fairly unremarkable for mature Parkinson's standpoint.  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  Patient does follow with Dr. 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  Will increase that to max of 10 mg and see if helpful.  If not, will try klonopin and he and I discussed r/b/se  Subjective:   Casey Allen was seen today in follow up for Parkinsons disease. My previous records were reviewed prior to todays visit as well as outside records available to me.  Patient remains on levodopa  and pramipexole .  He has had no compulsive behaviors.  No sleep attacks.  No near syncope.  No hallucinations.  We increased his melatonin last visit for RBD, but told him if it did not help, we may have to try low-dose clonazepam.  He reports today that ***.  Current prescribed movement disorder medications: Carbidopa /levodopa  25/100 CR, 1 tablet 3 times per day  Zofran  as needed Pramipexole  0.5 mg 3 times per day  Melatonin, 10 mg at bed  PREVIOUS MEDICATIONS: carbidopa /levodopa  25/100 IR (some nausea); pramipexole    ALLERGIES:   No Known Allergies   CURRENT MEDICATIONS:  No outpatient medications have been marked as taking for the 08/31/24 encounter (Appointment) with Jalila Goodnough, [place]berry RAMAN, DO.     Objective:   PHYSICAL EXAMINATION:    VITALS:   There were no vitals filed for this  visit.   Wt Readings from Last 3 Encounters:  06/10/24 197 lb (89.4 kg)  02/09/24 201 lb 6.4 oz (91.4 kg)  11/24/23 196 lb 6.4 oz (89.1 kg)     GEN:  The patient appears stated age and is in NAD. HEENT:  Normocephalic, atraumatic.  The mucous membranes are moist. The superficial temporal arteries are without ropiness or tenderness. CV:  RRR Lungs:   CTAB Neck:  no bruits  Neurological examination:  Orientation: The patient is alert and oriented x3.  Cranial nerves: There is good facial symmetry.  Extraocular muscles are intact. The visual fields are full to confrontational testing. The speech is fluent and clear. Soft palate rises symmetrically and there is no tongue deviation. Hearing is intact to conversational tone. Sensation: Sensation is intact to light touch throughout  Motor: Strength is at least antigravity x 4   Movement examination: Tone: There is mild increased tone in the LUE/LLE Abnormal movements: there is no rest tremor.   Coordination:  There is mild decremation with hand opening and closing on the L Gait and Station: The patient has no difficulty arising out of a deep-seated chair without the use of the hands. The patient's stride length is good with good arm swing on the L.  The patient has a negative pull test.    I have reviewed and interpreted the following labs independently    Chemistry      Component Value  Date/Time   NA 142 06/10/2024 0913   K 3.7 06/10/2024 0913   CL 100 06/10/2024 0913   CO2 31 06/10/2024 0913   BUN 14 06/10/2024 0913   CREATININE 1.34 06/10/2024 0913      Component Value Date/Time   CALCIUM 9.1 06/10/2024 0913   ALKPHOS 40 06/10/2024 0913   AST 18 06/10/2024 0913   ALT 5 06/10/2024 0913   BILITOT 0.7 06/10/2024 0913       Lab Results  Component Value Date   WBC 5.1 06/10/2024   HGB 13.6 06/10/2024   HCT 40.6 06/10/2024   MCV 92.5 06/10/2024   PLT 218.0 06/10/2024    Lab Results  Component Value Date   TSH 0.55  06/10/2024    Total time spent on today's visit was *** minutes, including both face-to-face time and nonface-to-face time.  Time included that spent on review of records (prior notes available to me/labs/imaging if pertinent), discussing treatment and goals, answering patient's questions and coordinating care.    Cc:  Casey Allen LABOR, MD

## 2024-08-31 ENCOUNTER — Encounter: Payer: Self-pay | Admitting: Neurology

## 2024-08-31 ENCOUNTER — Ambulatory Visit: Admitting: Neurology

## 2024-08-31 VITALS — BP 128/82 | HR 92 | Wt 195.2 lb

## 2024-08-31 DIAGNOSIS — G4710 Hypersomnia, unspecified: Secondary | ICD-10-CM

## 2024-08-31 DIAGNOSIS — G20A1 Parkinson's disease without dyskinesia, without mention of fluctuations: Secondary | ICD-10-CM

## 2024-08-31 MED ORDER — CARBIDOPA-LEVODOPA ER 25-100 MG PO TBCR: EXTENDED_RELEASE_TABLET | ORAL | 1 refills | Status: AC

## 2024-08-31 NOTE — Patient Instructions (Signed)
 Decrease pramipexole  to 1 tablet twice per day x 1 week and then 1 tablet twice per day for 1 week and then STOP the pramipexole    AFTER you have completed the above (in 2 weeks), then start increasing carbidopa /levodopa  25/100 CR, 2 in 6 am, 1 at 11 am, 1 at 4pm x 1 week and then increase to carbidopa /levodopa  25/100 CR, 2 at 6 am, 2 at 11am, 1 at 4pm.  We may need to increase further next time or change to another version at next visit.

## 2024-09-16 ENCOUNTER — Other Ambulatory Visit

## 2024-09-16 ENCOUNTER — Ambulatory Visit: Payer: Self-pay | Admitting: Family Medicine

## 2024-09-16 DIAGNOSIS — R972 Elevated prostate specific antigen [PSA]: Secondary | ICD-10-CM

## 2024-09-16 LAB — PSA: PSA: 1.79 ng/mL (ref 0.10–4.00)

## 2024-09-19 ENCOUNTER — Encounter: Payer: Self-pay | Admitting: Neurology

## 2024-09-27 NOTE — Telephone Encounter (Signed)
 Ca not use [employer] do you want me to send a message for a PA for Xeomin ?

## 2024-09-28 ENCOUNTER — Telehealth: Payer: Self-pay | Admitting: Pharmacy Technician

## 2024-09-28 ENCOUNTER — Other Ambulatory Visit (HOSPITAL_COMMUNITY): Payer: Self-pay

## 2024-09-28 NOTE — Telephone Encounter (Signed)
 PA has been submitted, and telephone encounter has been created. Please see telephone encounter dated 11.26.25.

## 2024-09-28 NOTE — Telephone Encounter (Signed)
 Pharmacy Patient Advocate Encounter   Received notification from Patient Advice Request messages that prior authorization for XEOMIN 100 is required/requested.   Insurance verification completed.   The patient is insured through [employer].   Per test claim: PA required; PA started via CoverMyMeds. KEY [id number] . Please see clinical question(s) below that I am not finding the answer to in their chart and advise.    CPT CODE 35387

## 2025-03-01 ENCOUNTER — Ambulatory Visit: Admitting: Neurology
